# Patient Record
Sex: Female | Born: 1950 | Race: Black or African American | Hispanic: No
Health system: Southern US, Community
[De-identification: ages and names within clinical notes are randomized; demographics above are authoritative.]

---

## 2013-01-13 ENCOUNTER — Inpatient Hospital Stay: Payer: Self-pay | Admitting: Internal Medicine

## 2013-01-13 DIAGNOSIS — I059 Rheumatic mitral valve disease, unspecified: Secondary | ICD-10-CM

## 2013-01-13 LAB — CBC
HCT: 38.1 % (ref 35.0–47.0)
MCH: 28.1 pg (ref 26.0–34.0)
MCV: 84 fL (ref 80–100)
Platelet: 277 10*3/uL (ref 150–440)
RDW: 14.4 % (ref 11.5–14.5)
WBC: 14.1 10*3/uL — ABNORMAL HIGH (ref 3.6–11.0)

## 2013-01-13 LAB — BASIC METABOLIC PANEL
Anion Gap: 4 — ABNORMAL LOW (ref 7–16)
BUN: 13 mg/dL (ref 7–18)
Calcium, Total: 9.4 mg/dL (ref 8.5–10.1)
Chloride: 103 mmol/L (ref 98–107)
Co2: 31 mmol/L (ref 21–32)
Creatinine: 0.79 mg/dL (ref 0.60–1.30)
Glucose: 137 mg/dL — ABNORMAL HIGH (ref 65–99)
Osmolality: 278 (ref 275–301)
Sodium: 138 mmol/L (ref 136–145)

## 2013-01-13 LAB — TROPONIN I: Troponin-I: <0.02 ng/mL

## 2013-01-13 LAB — PRO B NATRIURETIC PEPTIDE: B-Type Natriuretic Peptide: 152 pg/mL — ABNORMAL HIGH (ref 0–125)

## 2013-01-13 LAB — CK TOTAL AND CKMB (NOT AT ARMC): CK, Total: 248 U/L — ABNORMAL HIGH (ref 21–215)

## 2013-02-03 ENCOUNTER — Inpatient Hospital Stay: Payer: Self-pay | Admitting: Internal Medicine

## 2013-02-03 LAB — COMPREHENSIVE METABOLIC PANEL
Albumin: 3.4 g/dL (ref 3.4–5.0)
BUN: 10 mg/dL (ref 7–18)
Chloride: 104 mmol/L (ref 98–107)
Co2: 31 mmol/L (ref 21–32)
Creatinine: 0.58 mg/dL — ABNORMAL LOW (ref 0.60–1.30)
EGFR (African American): >60
Osmolality: 279 (ref 275–301)
Potassium: 3.7 mmol/L (ref 3.5–5.1)
SGPT (ALT): 26 U/L (ref 12–78)
Sodium: 140 mmol/L (ref 136–145)

## 2013-02-03 LAB — URINALYSIS, COMPLETE
Bilirubin,UR: NEGATIVE
Blood: NEGATIVE
Glucose,UR: NEGATIVE mg/dL (ref 0–75)
RBC,UR: 3 /HPF (ref 0–5)
Squamous Epithelial: 2

## 2013-02-03 LAB — CBC
HCT: 35.7 % (ref 35.0–47.0)
MCHC: 33.6 g/dL (ref 32.0–36.0)
MCV: 84 fL (ref 80–100)
WBC: 11.6 10*3/uL — ABNORMAL HIGH (ref 3.6–11.0)

## 2013-02-03 LAB — PROTIME-INR: Prothrombin Time: 12.8 secs (ref 11.5–14.7)

## 2013-02-04 LAB — CBC WITH DIFFERENTIAL/PLATELET
Basophil #: 0 10*3/uL (ref 0.0–0.1)
Basophil %: 0 %
Eosinophil #: 0 10*3/uL (ref 0.0–0.7)
Eosinophil %: 0 %
HCT: 38.4 % (ref 35.0–47.0)
Lymphocyte #: 1.4 10*3/uL (ref 1.0–3.6)
MCH: 28.2 pg (ref 26.0–34.0)
MCHC: 33 g/dL (ref 32.0–36.0)
MCV: 86 fL (ref 80–100)
Monocyte #: 0.2 x10 3/mm (ref 0.2–0.9)
Monocyte %: 1.7 %
Neutrophil #: 11.1 10*3/uL — ABNORMAL HIGH (ref 1.4–6.5)
Neutrophil %: 87.5 %
Platelet: 281 10*3/uL (ref 150–440)
RBC: 4.49 10*6/uL (ref 3.80–5.20)
RDW: 14.2 % (ref 11.5–14.5)

## 2013-02-04 LAB — BASIC METABOLIC PANEL
Co2: 28 mmol/L (ref 21–32)
EGFR (African American): >60
EGFR (Non-African Amer.): 54 — ABNORMAL LOW
Glucose: 155 mg/dL — ABNORMAL HIGH (ref 65–99)
Osmolality: 279 (ref 275–301)
Potassium: 3.8 mmol/L (ref 3.5–5.1)
Sodium: 137 mmol/L (ref 136–145)

## 2013-02-08 LAB — EXPECTORATED SPUTUM ASSESSMENT W GRAM STAIN, RFLX TO RESP C

## 2013-02-08 LAB — CULTURE, BLOOD (SINGLE)

## 2013-02-08 LAB — BASIC METABOLIC PANEL
BUN: 22 mg/dL — ABNORMAL HIGH (ref 7–18)
EGFR (African American): >60
EGFR (Non-African Amer.): >60
Sodium: 137 mmol/L (ref 136–145)

## 2013-08-16 ENCOUNTER — Emergency Department: Payer: Self-pay | Admitting: Emergency Medicine

## 2013-08-16 LAB — CBC WITH DIFFERENTIAL/PLATELET
Basophil #: 0.1 10*3/uL (ref 0.0–0.1)
Basophil %: 0.9 %
Eosinophil #: 0.2 10*3/uL (ref 0.0–0.7)
Eosinophil %: 1.6 %
HCT: 40.7 % (ref 35.0–47.0)
HGB: 13.3 g/dL (ref 12.0–16.0)
Lymphocyte #: 2.6 10*3/uL (ref 1.0–3.6)
Lymphocyte %: 23 %
MCH: 27.1 pg (ref 26.0–34.0)
MCHC: 32.7 g/dL (ref 32.0–36.0)
MCV: 83 fL (ref 80–100)
MONO ABS: 0.5 x10 3/mm (ref 0.2–0.9)
Monocyte %: 4.3 %
NEUTROS PCT: 70.2 %
Neutrophil #: 8 10*3/uL — ABNORMAL HIGH (ref 1.4–6.5)
Platelet: 345 10*3/uL (ref 150–440)
RBC: 4.91 10*6/uL (ref 3.80–5.20)
RDW: 13.9 % (ref 11.5–14.5)
WBC: 11.4 10*3/uL — ABNORMAL HIGH (ref 3.6–11.0)

## 2013-08-16 LAB — BASIC METABOLIC PANEL
Anion Gap: 12 (ref 7–16)
BUN: 20 mg/dL — AB (ref 7–18)
CALCIUM: 10 mg/dL (ref 8.5–10.1)
CO2: 24 mmol/L (ref 21–32)
CREATININE: 1.05 mg/dL (ref 0.60–1.30)
Chloride: 94 mmol/L — ABNORMAL LOW (ref 98–107)
EGFR (African American): >60
GFR CALC NON AF AMER: 56 — AB
Glucose: 409 mg/dL — ABNORMAL HIGH (ref 65–99)
OSMOLALITY: 281 (ref 275–301)
Potassium: 3.7 mmol/L (ref 3.5–5.1)
Sodium: 130 mmol/L — ABNORMAL LOW (ref 136–145)

## 2013-08-22 ENCOUNTER — Inpatient Hospital Stay: Payer: Self-pay | Admitting: Internal Medicine

## 2013-08-22 LAB — COMPREHENSIVE METABOLIC PANEL
ALK PHOS: 133 U/L — AB
ANION GAP: 8 (ref 7–16)
Albumin: 3.4 g/dL (ref 3.4–5.0)
BUN: 19 mg/dL — AB (ref 7–18)
Bilirubin,Total: 0.5 mg/dL (ref 0.2–1.0)
CALCIUM: 10.5 mg/dL — AB (ref 8.5–10.1)
CHLORIDE: 89 mmol/L — AB (ref 98–107)
Co2: 28 mmol/L (ref 21–32)
Creatinine: 1.54 mg/dL — ABNORMAL HIGH (ref 0.60–1.30)
EGFR (African American): 41 — ABNORMAL LOW
EGFR (Non-African Amer.): 35 — ABNORMAL LOW
Glucose: 481 mg/dL — ABNORMAL HIGH (ref 65–99)
Osmolality: 275 (ref 275–301)
Potassium: 3.6 mmol/L (ref 3.5–5.1)
SGOT(AST): 20 U/L (ref 15–37)
SGPT (ALT): 17 U/L (ref 12–78)
Sodium: 125 mmol/L — ABNORMAL LOW (ref 136–145)
Total Protein: 8 g/dL (ref 6.4–8.2)

## 2013-08-22 LAB — CBC
HCT: 40.5 % (ref 35.0–47.0)
HGB: 13.4 g/dL (ref 12.0–16.0)
MCH: 27.5 pg (ref 26.0–34.0)
MCHC: 33.1 g/dL (ref 32.0–36.0)
MCV: 83 fL (ref 80–100)
Platelet: 277 10*3/uL (ref 150–440)
RBC: 4.87 10*6/uL (ref 3.80–5.20)
RDW: 14.2 % (ref 11.5–14.5)
WBC: 8.7 10*3/uL (ref 3.6–11.0)

## 2013-08-22 LAB — URINALYSIS, COMPLETE
BACTERIA: NONE SEEN
Bilirubin,UR: NEGATIVE
Hyaline Cast: 16
NITRITE: NEGATIVE
PH: 5 (ref 4.5–8.0)
Protein: NEGATIVE
RBC,UR: 5 /HPF (ref 0–5)
Specific Gravity: 1.021 (ref 1.003–1.030)
Squamous Epithelial: 8

## 2013-08-22 LAB — TROPONIN I

## 2013-08-23 LAB — LIPID PANEL
Cholesterol: 144 mg/dL (ref 0–200)
HDL Cholesterol: 36 mg/dL — ABNORMAL LOW (ref 40–60)
Ldl Cholesterol, Calc: 88 mg/dL (ref 0–100)
Triglycerides: 101 mg/dL (ref 0–200)
VLDL CHOLESTEROL, CALC: 20 mg/dL (ref 5–40)

## 2013-08-23 LAB — CBC WITH DIFFERENTIAL/PLATELET
BASOS ABS: 0.1 10*3/uL (ref 0.0–0.1)
BASOS PCT: 0.9 %
EOS ABS: 0.2 10*3/uL (ref 0.0–0.7)
Eosinophil %: 2.1 %
HCT: 32.6 % — ABNORMAL LOW (ref 35.0–47.0)
HGB: 11 g/dL — ABNORMAL LOW (ref 12.0–16.0)
LYMPHS PCT: 26 %
Lymphocyte #: 2.2 10*3/uL (ref 1.0–3.6)
MCH: 27.6 pg (ref 26.0–34.0)
MCHC: 33.6 g/dL (ref 32.0–36.0)
MCV: 82 fL (ref 80–100)
MONO ABS: 0.8 x10 3/mm (ref 0.2–0.9)
Monocyte %: 9.7 %
NEUTROS ABS: 5.1 10*3/uL (ref 1.4–6.5)
NEUTROS PCT: 61.3 %
Platelet: 242 10*3/uL (ref 150–440)
RBC: 3.98 10*6/uL (ref 3.80–5.20)
RDW: 14.2 % (ref 11.5–14.5)
WBC: 8.3 10*3/uL (ref 3.6–11.0)

## 2013-08-23 LAB — COMPREHENSIVE METABOLIC PANEL
ALT: 10 U/L — AB (ref 12–78)
ANION GAP: 7 (ref 7–16)
AST: 10 U/L — AB (ref 15–37)
Albumin: 2.7 g/dL — ABNORMAL LOW (ref 3.4–5.0)
Alkaline Phosphatase: 106 U/L
BILIRUBIN TOTAL: 0.4 mg/dL (ref 0.2–1.0)
BUN: 18 mg/dL (ref 7–18)
CO2: 27 mmol/L (ref 21–32)
CREATININE: 1.15 mg/dL (ref 0.60–1.30)
Calcium, Total: 9.8 mg/dL (ref 8.5–10.1)
Chloride: 97 mmol/L — ABNORMAL LOW (ref 98–107)
EGFR (African American): 58 — ABNORMAL LOW
GFR CALC NON AF AMER: 50 — AB
Glucose: 313 mg/dL — ABNORMAL HIGH (ref 65–99)
OSMOLALITY: 276 (ref 275–301)
POTASSIUM: 3.2 mmol/L — AB (ref 3.5–5.1)
Sodium: 131 mmol/L — ABNORMAL LOW (ref 136–145)
TOTAL PROTEIN: 6.5 g/dL (ref 6.4–8.2)

## 2013-08-23 LAB — TROPONIN I: Troponin-I: <0.02 ng/mL

## 2013-08-23 LAB — CK TOTAL AND CKMB (NOT AT ARMC)
CK, TOTAL: 56 U/L
CK, TOTAL: 59 U/L
CK, Total: 47 U/L
CK-MB: 0.5 ng/mL (ref 0.5–3.6)
CK-MB: 0.6 ng/mL (ref 0.5–3.6)
CK-MB: 0.7 ng/mL (ref 0.5–3.6)

## 2013-08-23 LAB — TSH: Thyroid Stimulating Horm: 0.604 u[IU]/mL

## 2013-08-23 LAB — MAGNESIUM
MAGNESIUM: 2 mg/dL
Magnesium: 1.1 mg/dL — ABNORMAL LOW

## 2013-08-23 LAB — HEMOGLOBIN A1C: Hemoglobin A1C: 12.2 % — ABNORMAL HIGH (ref 4.2–6.3)

## 2013-08-24 ENCOUNTER — Ambulatory Visit: Payer: Self-pay | Admitting: Internal Medicine

## 2013-08-24 LAB — CBC WITH DIFFERENTIAL/PLATELET
BASOS PCT: 0.9 %
Basophil #: 0.1 10*3/uL (ref 0.0–0.1)
EOS ABS: 0.1 10*3/uL (ref 0.0–0.7)
Eosinophil %: 1.1 %
HCT: 33.9 % — ABNORMAL LOW (ref 35.0–47.0)
HGB: 11.5 g/dL — ABNORMAL LOW (ref 12.0–16.0)
LYMPHS PCT: 15.9 %
Lymphocyte #: 1.4 10*3/uL (ref 1.0–3.6)
MCH: 27.9 pg (ref 26.0–34.0)
MCHC: 33.9 g/dL (ref 32.0–36.0)
MCV: 82 fL (ref 80–100)
MONO ABS: 1 x10 3/mm — AB (ref 0.2–0.9)
Monocyte %: 11.7 %
Neutrophil #: 6.2 10*3/uL (ref 1.4–6.5)
Neutrophil %: 70.4 %
Platelet: 220 10*3/uL (ref 150–440)
RBC: 4.13 10*6/uL (ref 3.80–5.20)
RDW: 14.3 % (ref 11.5–14.5)
WBC: 8.8 10*3/uL (ref 3.6–11.0)

## 2013-08-24 LAB — PROTIME-INR
INR: 1
Prothrombin Time: 13.1 secs (ref 11.5–14.7)

## 2013-08-24 LAB — URINE CULTURE

## 2013-08-24 LAB — BASIC METABOLIC PANEL
Anion Gap: 7 (ref 7–16)
BUN: 7 mg/dL (ref 7–18)
CHLORIDE: 100 mmol/L (ref 98–107)
CO2: 25 mmol/L (ref 21–32)
Calcium, Total: 9 mg/dL (ref 8.5–10.1)
Creatinine: 0.8 mg/dL (ref 0.60–1.30)
EGFR (African American): >60
GLUCOSE: 229 mg/dL — AB (ref 65–99)
Osmolality: 270 (ref 275–301)
POTASSIUM: 3.8 mmol/L (ref 3.5–5.1)
Sodium: 132 mmol/L — ABNORMAL LOW (ref 136–145)

## 2013-08-26 LAB — CBC WITH DIFFERENTIAL/PLATELET
Basophil #: 0.1 10*3/uL (ref 0.0–0.1)
Basophil %: 0.8 %
EOS ABS: 0.2 10*3/uL (ref 0.0–0.7)
EOS PCT: 2 %
HCT: 31.6 % — ABNORMAL LOW (ref 35.0–47.0)
HGB: 10.7 g/dL — AB (ref 12.0–16.0)
LYMPHS ABS: 1.8 10*3/uL (ref 1.0–3.6)
Lymphocyte %: 18.3 %
MCH: 27.5 pg (ref 26.0–34.0)
MCHC: 33.7 g/dL (ref 32.0–36.0)
MCV: 82 fL (ref 80–100)
MONOS PCT: 12.6 %
Monocyte #: 1.2 x10 3/mm — ABNORMAL HIGH (ref 0.2–0.9)
Neutrophil #: 6.5 10*3/uL (ref 1.4–6.5)
Neutrophil %: 66.3 %
PLATELETS: 210 10*3/uL (ref 150–440)
RBC: 3.87 10*6/uL (ref 3.80–5.20)
RDW: 14.3 % (ref 11.5–14.5)
WBC: 9.8 10*3/uL (ref 3.6–11.0)

## 2013-08-26 LAB — BASIC METABOLIC PANEL
Anion Gap: 5 — ABNORMAL LOW (ref 7–16)
BUN: 5 mg/dL — AB (ref 7–18)
CHLORIDE: 101 mmol/L (ref 98–107)
CO2: 26 mmol/L (ref 21–32)
Calcium, Total: 8.5 mg/dL (ref 8.5–10.1)
Creatinine: 0.72 mg/dL (ref 0.60–1.30)
EGFR (Non-African Amer.): >60
Glucose: 156 mg/dL — ABNORMAL HIGH (ref 65–99)
OSMOLALITY: 265 (ref 275–301)
POTASSIUM: 4.3 mmol/L (ref 3.5–5.1)
Sodium: 132 mmol/L — ABNORMAL LOW (ref 136–145)

## 2013-08-26 LAB — PROTIME-INR
INR: 1
Prothrombin Time: 13.2 secs (ref 11.5–14.7)

## 2013-08-27 LAB — CULTURE, BLOOD (SINGLE)

## 2013-09-02 ENCOUNTER — Ambulatory Visit: Payer: Self-pay | Admitting: Internal Medicine

## 2013-09-07 ENCOUNTER — Ambulatory Visit: Payer: Self-pay

## 2013-09-08 LAB — COMPREHENSIVE METABOLIC PANEL
ANION GAP: 9 (ref 7–16)
AST: 23 U/L (ref 15–37)
Albumin: 2.9 g/dL — ABNORMAL LOW (ref 3.4–5.0)
Alkaline Phosphatase: 93 U/L
BUN: 9 mg/dL (ref 7–18)
Bilirubin,Total: 0.3 mg/dL (ref 0.2–1.0)
CREATININE: 0.8 mg/dL (ref 0.60–1.30)
Calcium, Total: 8.7 mg/dL (ref 8.5–10.1)
Chloride: 103 mmol/L (ref 98–107)
Co2: 29 mmol/L (ref 21–32)
EGFR (Non-African Amer.): >60
GLUCOSE: 150 mg/dL — AB (ref 65–99)
Osmolality: 283 (ref 275–301)
POTASSIUM: 3 mmol/L — AB (ref 3.5–5.1)
SGPT (ALT): 16 U/L (ref 12–78)
Sodium: 141 mmol/L (ref 136–145)
TOTAL PROTEIN: 7.1 g/dL (ref 6.4–8.2)

## 2013-09-08 LAB — CBC CANCER CENTER
BASOS ABS: 0 x10 3/mm (ref 0.0–0.1)
BASOS PCT: 0.5 %
Eosinophil #: 0.4 x10 3/mm (ref 0.0–0.7)
Eosinophil %: 4.9 %
HCT: 34.1 % — ABNORMAL LOW (ref 35.0–47.0)
HGB: 10.7 g/dL — ABNORMAL LOW (ref 12.0–16.0)
LYMPHS ABS: 2.1 x10 3/mm (ref 1.0–3.6)
Lymphocyte %: 27.4 %
MCH: 26.1 pg (ref 26.0–34.0)
MCHC: 31.4 g/dL — ABNORMAL LOW (ref 32.0–36.0)
MCV: 83 fL (ref 80–100)
Monocyte #: 0.5 x10 3/mm (ref 0.2–0.9)
Monocyte %: 6.1 %
NEUTROS ABS: 4.6 x10 3/mm (ref 1.4–6.5)
Neutrophil %: 61.1 %
Platelet: 258 x10 3/mm (ref 150–440)
RBC: 4.11 10*6/uL (ref 3.80–5.20)
RDW: 15.1 % — ABNORMAL HIGH (ref 11.5–14.5)
WBC: 7.5 x10 3/mm (ref 3.6–11.0)

## 2013-09-08 LAB — LACTATE DEHYDROGENASE: LDH: 199 U/L (ref 81–246)

## 2013-09-09 LAB — PATHOLOGY REPORT

## 2013-09-13 ENCOUNTER — Emergency Department: Payer: Self-pay | Admitting: Emergency Medicine

## 2013-09-13 LAB — BASIC METABOLIC PANEL
ANION GAP: 5 — AB (ref 7–16)
Anion Gap: 10 (ref 7–16)
BUN: 11 mg/dL (ref 7–18)
BUN: 10 mg/dL (ref 7–18)
CALCIUM: 8.8 mg/dL (ref 8.5–10.1)
CO2: 28 mmol/L (ref 21–32)
CREATININE: 0.83 mg/dL (ref 0.60–1.30)
CREATININE: 0.76 mg/dL (ref 0.60–1.30)
Calcium, Total: 9.3 mg/dL (ref 8.5–10.1)
Chloride: 100 mmol/L (ref 98–107)
Chloride: 98 mmol/L (ref 98–107)
Co2: 28 mmol/L (ref 21–32)
EGFR (African American): >60
GLUCOSE: 152 mg/dL — AB (ref 65–99)
GLUCOSE: 242 mg/dL — AB (ref 65–99)
Osmolality: 278 (ref 275–301)
Osmolality: 270 (ref 275–301)
POTASSIUM: 3.3 mmol/L — AB (ref 3.5–5.1)
Potassium: 3.5 mmol/L (ref 3.5–5.1)
Sodium: 138 mmol/L (ref 136–145)
Sodium: 131 mmol/L — ABNORMAL LOW (ref 136–145)

## 2013-09-13 LAB — CBC CANCER CENTER
BASOS PCT: 0.5 %
Basophil #: 0.1 x10 3/mm (ref 0.0–0.1)
Eosinophil #: 0.1 x10 3/mm (ref 0.0–0.7)
Eosinophil %: 0.6 %
HCT: 35.6 % (ref 35.0–47.0)
HGB: 11.5 g/dL — ABNORMAL LOW (ref 12.0–16.0)
LYMPHS ABS: 2.2 x10 3/mm (ref 1.0–3.6)
Lymphocyte %: 20.8 %
MCH: 26.6 pg (ref 26.0–34.0)
MCHC: 32.2 g/dL (ref 32.0–36.0)
MCV: 83 fL (ref 80–100)
Monocyte #: 0.6 x10 3/mm (ref 0.2–0.9)
Monocyte %: 6 %
NEUTROS PCT: 72.1 %
Neutrophil #: 7.7 x10 3/mm — ABNORMAL HIGH (ref 1.4–6.5)
Platelet: 251 x10 3/mm (ref 150–440)
RBC: 4.31 10*6/uL (ref 3.80–5.20)
RDW: 15.2 % — AB (ref 11.5–14.5)
WBC: 10.7 x10 3/mm (ref 3.6–11.0)

## 2013-09-13 LAB — URINALYSIS, COMPLETE
Bacteria: NONE SEEN
Bilirubin,UR: NEGATIVE
Blood: NEGATIVE
Glucose,UR: >=500 mg/dL (ref 0–75)
Hyaline Cast: 3
Ketone: NEGATIVE
Leukocyte Esterase: NEGATIVE
Nitrite: NEGATIVE
Ph: 6 (ref 4.5–8.0)
Protein: NEGATIVE
RBC, UR: NONE SEEN /HPF (ref 0–5)
SPECIFIC GRAVITY: 1.016 (ref 1.003–1.030)
Squamous Epithelial: 2

## 2013-09-13 LAB — CBC
HCT: 36.1 % (ref 35.0–47.0)
HGB: 11.6 g/dL — AB (ref 12.0–16.0)
MCH: 26.8 pg (ref 26.0–34.0)
MCHC: 32.1 g/dL (ref 32.0–36.0)
MCV: 83 fL (ref 80–100)
Platelet: 238 10*3/uL (ref 150–440)
RBC: 4.33 10*6/uL (ref 3.80–5.20)
RDW: 15.2 % — AB (ref 11.5–14.5)
WBC: 10.8 10*3/uL (ref 3.6–11.0)

## 2013-09-13 LAB — HEPATIC FUNCTION PANEL A (ARMC)
ALBUMIN: 3.2 g/dL — AB (ref 3.4–5.0)
ALT: 18 U/L (ref 12–78)
Alkaline Phosphatase: 93 U/L
BILIRUBIN DIRECT: 0.1 mg/dL (ref 0.00–0.20)
Bilirubin,Total: 0.4 mg/dL (ref 0.2–1.0)
SGOT(AST): 21 U/L (ref 15–37)
Total Protein: 7.6 g/dL (ref 6.4–8.2)

## 2013-09-13 LAB — TROPONIN I: Troponin-I: <0.02 ng/mL

## 2013-09-13 LAB — LIPASE, BLOOD: Lipase: 912 U/L — ABNORMAL HIGH (ref 73–393)

## 2013-09-15 ENCOUNTER — Ambulatory Visit: Payer: Self-pay | Admitting: Internal Medicine

## 2013-09-15 LAB — LIPASE, BLOOD: Lipase: 409 U/L — ABNORMAL HIGH (ref 73–393)

## 2013-09-20 LAB — CBC CANCER CENTER
BASOS PCT: 0.9 %
Basophil #: 0 x10 3/mm (ref 0.0–0.1)
EOS PCT: 0.8 %
Eosinophil #: 0 x10 3/mm (ref 0.0–0.7)
HCT: 32.3 % — AB (ref 35.0–47.0)
HGB: 10.3 g/dL — AB (ref 12.0–16.0)
Lymphocyte #: 0.9 x10 3/mm — ABNORMAL LOW (ref 1.0–3.6)
Lymphocyte %: 19.9 %
MCH: 26.5 pg (ref 26.0–34.0)
MCHC: 31.8 g/dL — ABNORMAL LOW (ref 32.0–36.0)
MCV: 83 fL (ref 80–100)
Monocyte #: 0.1 x10 3/mm — ABNORMAL LOW (ref 0.2–0.9)
Monocyte %: 1.7 %
Neutrophil #: 3.3 x10 3/mm (ref 1.4–6.5)
Neutrophil %: 76.7 %
PLATELETS: 288 x10 3/mm (ref 150–440)
RBC: 3.88 10*6/uL (ref 3.80–5.20)
RDW: 15.3 % — AB (ref 11.5–14.5)
WBC: 4.4 x10 3/mm (ref 3.6–11.0)

## 2013-09-27 LAB — CBC CANCER CENTER
BASOS PCT: 0.5 %
Basophil #: 0 x10 3/mm (ref 0.0–0.1)
EOS ABS: 0 x10 3/mm (ref 0.0–0.7)
Eosinophil %: 0.5 %
HCT: 34.6 % — AB (ref 35.0–47.0)
HGB: 11 g/dL — ABNORMAL LOW (ref 12.0–16.0)
Lymphocyte #: 1.4 x10 3/mm (ref 1.0–3.6)
Lymphocyte %: 55 %
MCH: 26.4 pg (ref 26.0–34.0)
MCHC: 31.7 g/dL — ABNORMAL LOW (ref 32.0–36.0)
MCV: 83 fL (ref 80–100)
Monocyte #: 0.4 x10 3/mm (ref 0.2–0.9)
Monocyte %: 16.1 %
Neutrophil #: 0.7 x10 3/mm — ABNORMAL LOW (ref 1.4–6.5)
Neutrophil %: 27.9 %
Platelet: 260 x10 3/mm (ref 150–440)
RBC: 4.15 10*6/uL (ref 3.80–5.20)
RDW: 15.3 % — ABNORMAL HIGH (ref 11.5–14.5)
WBC: 2.6 x10 3/mm — ABNORMAL LOW (ref 3.6–11.0)

## 2013-09-27 LAB — BASIC METABOLIC PANEL
ANION GAP: 12 (ref 7–16)
BUN: 10 mg/dL (ref 7–18)
CALCIUM: 9.8 mg/dL (ref 8.5–10.1)
Chloride: 98 mmol/L (ref 98–107)
Co2: 26 mmol/L (ref 21–32)
Creatinine: 0.87 mg/dL (ref 0.60–1.30)
EGFR (African American): >60
EGFR (Non-African Amer.): >60
Glucose: 127 mg/dL — ABNORMAL HIGH (ref 65–99)
OSMOLALITY: 273 (ref 275–301)
Potassium: 3.5 mmol/L (ref 3.5–5.1)
Sodium: 136 mmol/L (ref 136–145)

## 2013-09-27 LAB — HEPATIC FUNCTION PANEL A (ARMC)
ALK PHOS: 113 U/L
AST: 13 U/L — AB (ref 15–37)
Albumin: 3.5 g/dL (ref 3.4–5.0)
Bilirubin, Direct: 0.1 mg/dL (ref 0.00–0.20)
Bilirubin,Total: 0.2 mg/dL (ref 0.2–1.0)
SGPT (ALT): 20 U/L (ref 12–78)
TOTAL PROTEIN: 7.7 g/dL (ref 6.4–8.2)

## 2013-10-04 LAB — CBC CANCER CENTER
Basophil #: 0.1 x10 3/mm (ref 0.0–0.1)
Basophil %: 0.7 %
Eosinophil #: 0 x10 3/mm (ref 0.0–0.7)
Eosinophil %: 0.2 %
HCT: 33.9 % — ABNORMAL LOW (ref 35.0–47.0)
HGB: 10.9 g/dL — AB (ref 12.0–16.0)
Lymphocyte #: 2.4 x10 3/mm (ref 1.0–3.6)
Lymphocyte %: 19.4 %
MCH: 27 pg (ref 26.0–34.0)
MCHC: 32.3 g/dL (ref 32.0–36.0)
MCV: 84 fL (ref 80–100)
MONO ABS: 1.3 x10 3/mm — AB (ref 0.2–0.9)
Monocyte %: 10.8 %
NEUTROS ABS: 8.6 x10 3/mm — AB (ref 1.4–6.5)
Neutrophil %: 68.9 %
Platelet: 378 x10 3/mm (ref 150–440)
RBC: 4.06 10*6/uL (ref 3.80–5.20)
RDW: 16.3 % — ABNORMAL HIGH (ref 11.5–14.5)
WBC: 12.4 x10 3/mm — ABNORMAL HIGH (ref 3.6–11.0)

## 2013-10-04 LAB — CREATININE, SERUM
Creatinine: 0.82 mg/dL (ref 0.60–1.30)
EGFR (African American): >60
EGFR (Non-African Amer.): >60

## 2013-10-11 LAB — CBC CANCER CENTER
Bands: 4 %
Basophil: 1 %
HCT: 34.1 % — ABNORMAL LOW (ref 35.0–47.0)
HGB: 10.9 g/dL — AB (ref 12.0–16.0)
LYMPHS PCT: 19 %
MCH: 26.7 pg (ref 26.0–34.0)
MCHC: 31.9 g/dL — ABNORMAL LOW (ref 32.0–36.0)
MCV: 84 fL (ref 80–100)
Platelet: 215 x10 3/mm (ref 150–440)
RBC: 4.08 10*6/uL (ref 3.80–5.20)
RDW: 15.7 % — AB (ref 11.5–14.5)
Segmented Neutrophils: 75 %
Variant Lymphocyte: 1 %
WBC: 17.2 x10 3/mm — ABNORMAL HIGH (ref 3.6–11.0)

## 2013-10-15 ENCOUNTER — Ambulatory Visit: Payer: Self-pay | Admitting: Internal Medicine

## 2013-10-18 LAB — BASIC METABOLIC PANEL
ANION GAP: 12 (ref 7–16)
BUN: 12 mg/dL (ref 7–18)
CO2: 29 mmol/L (ref 21–32)
Calcium, Total: 8.5 mg/dL (ref 8.5–10.1)
Chloride: 101 mmol/L (ref 98–107)
Creatinine: 1.46 mg/dL — ABNORMAL HIGH (ref 0.60–1.30)
EGFR (Non-African Amer.): 38 — ABNORMAL LOW
GFR CALC AF AMER: 44 — AB
Glucose: 118 mg/dL — ABNORMAL HIGH (ref 65–99)
Osmolality: 284 (ref 275–301)
Potassium: 3.5 mmol/L (ref 3.5–5.1)
Sodium: 142 mmol/L (ref 136–145)

## 2013-10-18 LAB — HEPATIC FUNCTION PANEL A (ARMC)
ALK PHOS: 137 U/L — AB
ALT: 79 U/L — AB (ref 12–78)
Albumin: 3.7 g/dL (ref 3.4–5.0)
BILIRUBIN TOTAL: 0.2 mg/dL (ref 0.2–1.0)
Bilirubin, Direct: 0.1 mg/dL (ref 0.00–0.20)
SGOT(AST): 53 U/L — ABNORMAL HIGH (ref 15–37)
TOTAL PROTEIN: 7.7 g/dL (ref 6.4–8.2)

## 2013-10-18 LAB — CBC CANCER CENTER
BASOS ABS: 0.1 x10 3/mm (ref 0.0–0.1)
BASOS PCT: 0.8 %
EOS ABS: 0 x10 3/mm (ref 0.0–0.7)
EOS PCT: 0.2 %
HCT: 32.6 % — ABNORMAL LOW (ref 35.0–47.0)
HGB: 10.6 g/dL — ABNORMAL LOW (ref 12.0–16.0)
Lymphocyte #: 3.3 x10 3/mm (ref 1.0–3.6)
Lymphocyte %: 21.7 %
MCH: 26.9 pg (ref 26.0–34.0)
MCHC: 32.5 g/dL (ref 32.0–36.0)
MCV: 83 fL (ref 80–100)
Monocyte #: 0.8 x10 3/mm (ref 0.2–0.9)
Monocyte %: 5.5 %
Neutrophil #: 10.9 x10 3/mm — ABNORMAL HIGH (ref 1.4–6.5)
Neutrophil %: 71.8 %
Platelet: 62 x10 3/mm — ABNORMAL LOW (ref 150–440)
RBC: 3.94 10*6/uL (ref 3.80–5.20)
RDW: 15 % — AB (ref 11.5–14.5)
WBC: 15.2 x10 3/mm — ABNORMAL HIGH (ref 3.6–11.0)

## 2013-10-25 ENCOUNTER — Observation Stay: Payer: Self-pay | Admitting: Internal Medicine

## 2013-10-25 LAB — COMPREHENSIVE METABOLIC PANEL
ALBUMIN: 3.4 g/dL (ref 3.4–5.0)
Alkaline Phosphatase: 111 U/L
Anion Gap: 12 (ref 7–16)
BILIRUBIN TOTAL: 0.2 mg/dL (ref 0.2–1.0)
BUN: 23 mg/dL — ABNORMAL HIGH (ref 7–18)
CALCIUM: 8.2 mg/dL — AB (ref 8.5–10.1)
CHLORIDE: 99 mmol/L (ref 98–107)
Co2: 27 mmol/L (ref 21–32)
Creatinine: 3.23 mg/dL — ABNORMAL HIGH (ref 0.60–1.30)
EGFR (African American): 17 — ABNORMAL LOW
GFR CALC NON AF AMER: 14 — AB
Glucose: 102 mg/dL — ABNORMAL HIGH (ref 65–99)
Osmolality: 280 (ref 275–301)
POTASSIUM: 3 mmol/L — AB (ref 3.5–5.1)
SGOT(AST): 25 U/L (ref 15–37)
SGPT (ALT): 39 U/L (ref 12–78)
Sodium: 138 mmol/L (ref 136–145)
TOTAL PROTEIN: 7.1 g/dL (ref 6.4–8.2)

## 2013-10-25 LAB — CBC CANCER CENTER
Basophil #: 0 x10 3/mm (ref 0.0–0.1)
Basophil %: 0.4 %
Eosinophil #: 0 x10 3/mm (ref 0.0–0.7)
Eosinophil %: 0.3 %
HCT: 29.1 % — AB (ref 35.0–47.0)
HGB: 9.5 g/dL — ABNORMAL LOW (ref 12.0–16.0)
Lymphocyte #: 2.2 x10 3/mm (ref 1.0–3.6)
Lymphocyte %: 20.4 %
MCH: 27.6 pg (ref 26.0–34.0)
MCHC: 32.7 g/dL (ref 32.0–36.0)
MCV: 84 fL (ref 80–100)
Monocyte #: 0.9 x10 3/mm (ref 0.2–0.9)
Monocyte %: 8.6 %
NEUTROS ABS: 7.6 x10 3/mm — AB (ref 1.4–6.5)
Neutrophil %: 70.3 %
PLATELETS: 319 x10 3/mm (ref 150–440)
RBC: 3.46 10*6/uL — AB (ref 3.80–5.20)
RDW: 15.7 % — ABNORMAL HIGH (ref 11.5–14.5)
WBC: 10.8 x10 3/mm (ref 3.6–11.0)

## 2013-10-26 LAB — CBC WITH DIFFERENTIAL/PLATELET
Basophil #: 0 10*3/uL (ref 0.0–0.1)
Basophil %: 0.3 %
EOS PCT: 0.2 %
Eosinophil #: 0 10*3/uL (ref 0.0–0.7)
HCT: 25.3 % — AB (ref 35.0–47.0)
HGB: 8.4 g/dL — ABNORMAL LOW (ref 12.0–16.0)
Lymphocyte #: 1.9 10*3/uL (ref 1.0–3.6)
Lymphocyte %: 21.8 %
MCH: 28.2 pg (ref 26.0–34.0)
MCHC: 33.1 g/dL (ref 32.0–36.0)
MCV: 85 fL (ref 80–100)
Monocyte #: 0.8 x10 3/mm (ref 0.2–0.9)
Monocyte %: 9.3 %
NEUTROS PCT: 68.4 %
Neutrophil #: 5.9 10*3/uL (ref 1.4–6.5)
Platelet: 265 10*3/uL (ref 150–440)
RBC: 2.97 10*6/uL — ABNORMAL LOW (ref 3.80–5.20)
RDW: 15.8 % — ABNORMAL HIGH (ref 11.5–14.5)
WBC: 8.7 10*3/uL (ref 3.6–11.0)

## 2013-10-26 LAB — BASIC METABOLIC PANEL
Anion Gap: 7 (ref 7–16)
BUN: 18 mg/dL (ref 7–18)
Calcium, Total: 8 mg/dL — ABNORMAL LOW (ref 8.5–10.1)
Chloride: 103 mmol/L (ref 98–107)
Co2: 27 mmol/L (ref 21–32)
Creatinine: 1.53 mg/dL — ABNORMAL HIGH (ref 0.60–1.30)
EGFR (African American): 41 — ABNORMAL LOW
EGFR (Non-African Amer.): 36 — ABNORMAL LOW
Glucose: 99 mg/dL (ref 65–99)
Osmolality: 276 (ref 275–301)
Potassium: 3.3 mmol/L — ABNORMAL LOW (ref 3.5–5.1)
SODIUM: 137 mmol/L (ref 136–145)

## 2013-10-27 LAB — BASIC METABOLIC PANEL
Anion Gap: 8 (ref 7–16)
BUN: 8 mg/dL (ref 7–18)
CALCIUM: 7.9 mg/dL — AB (ref 8.5–10.1)
CO2: 27 mmol/L (ref 21–32)
Chloride: 107 mmol/L (ref 98–107)
Creatinine: 0.84 mg/dL (ref 0.60–1.30)
EGFR (Non-African Amer.): >60
Glucose: 86 mg/dL (ref 65–99)
OSMOLALITY: 281 (ref 275–301)
Potassium: 3.4 mmol/L — ABNORMAL LOW (ref 3.5–5.1)
SODIUM: 142 mmol/L (ref 136–145)

## 2013-11-01 LAB — BASIC METABOLIC PANEL
Anion Gap: 8 (ref 7–16)
BUN: 6 mg/dL — AB (ref 7–18)
CREATININE: 0.91 mg/dL (ref 0.60–1.30)
Calcium, Total: 8.6 mg/dL (ref 8.5–10.1)
Chloride: 103 mmol/L (ref 98–107)
Co2: 28 mmol/L (ref 21–32)
EGFR (African American): >60
EGFR (Non-African Amer.): >60
Glucose: 110 mg/dL — ABNORMAL HIGH (ref 65–99)
Osmolality: 276 (ref 275–301)
Potassium: 3.1 mmol/L — ABNORMAL LOW (ref 3.5–5.1)
Sodium: 139 mmol/L (ref 136–145)

## 2013-11-01 LAB — CBC CANCER CENTER
BASOS ABS: 0.1 x10 3/mm (ref 0.0–0.1)
BASOS PCT: 0.8 %
Eosinophil #: 0.1 x10 3/mm (ref 0.0–0.7)
Eosinophil %: 0.9 %
HCT: 28.8 % — ABNORMAL LOW (ref 35.0–47.0)
HGB: 9.7 g/dL — ABNORMAL LOW (ref 12.0–16.0)
Lymphocyte #: 1.4 x10 3/mm (ref 1.0–3.6)
Lymphocyte %: 17.7 %
MCH: 27.8 pg (ref 26.0–34.0)
MCHC: 33.7 g/dL (ref 32.0–36.0)
MCV: 83 fL (ref 80–100)
MONOS PCT: 12.2 %
Monocyte #: 1 x10 3/mm — ABNORMAL HIGH (ref 0.2–0.9)
NEUTROS PCT: 68.4 %
Neutrophil #: 5.5 x10 3/mm (ref 1.4–6.5)
Platelet: 215 x10 3/mm (ref 150–440)
RBC: 3.48 10*6/uL — ABNORMAL LOW (ref 3.80–5.20)
RDW: 16.6 % — ABNORMAL HIGH (ref 11.5–14.5)
WBC: 8 x10 3/mm (ref 3.6–11.0)

## 2013-11-07 ENCOUNTER — Observation Stay: Payer: Self-pay | Admitting: Internal Medicine

## 2013-11-07 LAB — COMPREHENSIVE METABOLIC PANEL
ALBUMIN: 3.2 g/dL — AB (ref 3.4–5.0)
ALK PHOS: 132 U/L — AB
ALT: 43 U/L (ref 12–78)
Anion Gap: 6 — ABNORMAL LOW (ref 7–16)
BUN: 16 mg/dL (ref 7–18)
Bilirubin,Total: 0.7 mg/dL (ref 0.2–1.0)
CALCIUM: 8.8 mg/dL (ref 8.5–10.1)
CHLORIDE: 102 mmol/L (ref 98–107)
CO2: 27 mmol/L (ref 21–32)
CREATININE: 1.04 mg/dL (ref 0.60–1.30)
EGFR (African American): >60
EGFR (Non-African Amer.): 57 — ABNORMAL LOW
GLUCOSE: 120 mg/dL — AB (ref 65–99)
Osmolality: 272 (ref 275–301)
Potassium: 3.4 mmol/L — ABNORMAL LOW (ref 3.5–5.1)
SGOT(AST): 48 U/L — ABNORMAL HIGH (ref 15–37)
SODIUM: 135 mmol/L — AB (ref 136–145)
Total Protein: 7.6 g/dL (ref 6.4–8.2)

## 2013-11-07 LAB — CBC
HCT: 29.5 % — ABNORMAL LOW (ref 35.0–47.0)
HGB: 9.6 g/dL — ABNORMAL LOW (ref 12.0–16.0)
MCH: 27.9 pg (ref 26.0–34.0)
MCHC: 32.5 g/dL (ref 32.0–36.0)
MCV: 86 fL (ref 80–100)
PLATELETS: 138 10*3/uL — AB (ref 150–440)
RBC: 3.44 10*6/uL — ABNORMAL LOW (ref 3.80–5.20)
RDW: 16.8 % — AB (ref 11.5–14.5)
WBC: 12.2 10*3/uL — AB (ref 3.6–11.0)

## 2013-11-07 LAB — TROPONIN I: Troponin-I: <0.02 ng/mL

## 2013-11-07 LAB — PRO B NATRIURETIC PEPTIDE: B-Type Natriuretic Peptide: 147 pg/mL — ABNORMAL HIGH (ref 0–125)

## 2013-11-08 LAB — POTASSIUM: Potassium: 3.6 mmol/L (ref 3.5–5.1)

## 2013-11-08 LAB — SODIUM: Sodium: 134 mmol/L — ABNORMAL LOW (ref 136–145)

## 2013-11-15 ENCOUNTER — Ambulatory Visit: Payer: Self-pay | Admitting: Internal Medicine

## 2013-11-15 LAB — HEPATIC FUNCTION PANEL A (ARMC)
ALK PHOS: 112 U/L
Albumin: 3.1 g/dL — ABNORMAL LOW (ref 3.4–5.0)
BILIRUBIN TOTAL: 0.2 mg/dL (ref 0.2–1.0)
Bilirubin, Direct: 0.1 mg/dL (ref 0.00–0.20)
SGOT(AST): 18 U/L (ref 15–37)
SGPT (ALT): 34 U/L (ref 12–78)
TOTAL PROTEIN: 7.2 g/dL (ref 6.4–8.2)

## 2013-11-15 LAB — CBC CANCER CENTER
BASOS ABS: 0.1 x10 3/mm (ref 0.0–0.1)
Basophil %: 1 %
EOS PCT: 0.3 %
Eosinophil #: 0 x10 3/mm (ref 0.0–0.7)
HCT: 25.8 % — ABNORMAL LOW (ref 35.0–47.0)
HGB: 8.4 g/dL — ABNORMAL LOW (ref 12.0–16.0)
LYMPHS PCT: 25.2 %
Lymphocyte #: 2.6 x10 3/mm (ref 1.0–3.6)
MCH: 28.3 pg (ref 26.0–34.0)
MCHC: 32.7 g/dL (ref 32.0–36.0)
MCV: 86 fL (ref 80–100)
MONO ABS: 0.9 x10 3/mm (ref 0.2–0.9)
Monocyte %: 8.7 %
Neutrophil #: 6.8 x10 3/mm — ABNORMAL HIGH (ref 1.4–6.5)
Neutrophil %: 64.8 %
Platelet: 36 x10 3/mm — ABNORMAL LOW (ref 150–440)
RBC: 2.99 10*6/uL — AB (ref 3.80–5.20)
RDW: 15.9 % — ABNORMAL HIGH (ref 11.5–14.5)
WBC: 10.5 x10 3/mm (ref 3.6–11.0)

## 2013-11-15 LAB — CREATININE, SERUM
Creatinine: 0.91 mg/dL (ref 0.60–1.30)
EGFR (African American): >60
EGFR (Non-African Amer.): >60

## 2013-11-22 LAB — CBC CANCER CENTER
BASOS ABS: 0.1 x10 3/mm (ref 0.0–0.1)
BASOS PCT: 0.6 %
EOS ABS: 0 x10 3/mm (ref 0.0–0.7)
Eosinophil %: 0.3 %
HCT: 26.7 % — ABNORMAL LOW (ref 35.0–47.0)
HGB: 8.7 g/dL — ABNORMAL LOW (ref 12.0–16.0)
LYMPHS PCT: 22.2 %
Lymphocyte #: 2 x10 3/mm (ref 1.0–3.6)
MCH: 28.6 pg (ref 26.0–34.0)
MCHC: 32.7 g/dL (ref 32.0–36.0)
MCV: 88 fL (ref 80–100)
MONOS PCT: 9.3 %
Monocyte #: 0.8 x10 3/mm (ref 0.2–0.9)
NEUTROS ABS: 6.2 x10 3/mm (ref 1.4–6.5)
Neutrophil %: 67.6 %
Platelet: 331 x10 3/mm (ref 150–440)
RBC: 3.05 10*6/uL — AB (ref 3.80–5.20)
RDW: 16.6 % — AB (ref 11.5–14.5)
WBC: 9.1 x10 3/mm (ref 3.6–11.0)

## 2013-11-22 LAB — BASIC METABOLIC PANEL
Anion Gap: 12 (ref 7–16)
BUN: 6 mg/dL — ABNORMAL LOW (ref 7–18)
CALCIUM: 9.1 mg/dL (ref 8.5–10.1)
CO2: 27 mmol/L (ref 21–32)
CREATININE: 0.88 mg/dL (ref 0.60–1.30)
Chloride: 104 mmol/L (ref 98–107)
EGFR (African American): >60
Glucose: 94 mg/dL (ref 65–99)
Osmolality: 282 (ref 275–301)
Potassium: 2.9 mmol/L — ABNORMAL LOW (ref 3.5–5.1)
Sodium: 143 mmol/L (ref 136–145)

## 2013-11-29 LAB — CBC CANCER CENTER
Basophil #: 0 x10 3/mm (ref 0.0–0.1)
Basophil %: 0.6 %
EOS PCT: 0.3 %
Eosinophil #: 0 x10 3/mm (ref 0.0–0.7)
HCT: 24.4 % — ABNORMAL LOW (ref 35.0–47.0)
HGB: 8.1 g/dL — ABNORMAL LOW (ref 12.0–16.0)
Lymphocyte #: 1.4 x10 3/mm (ref 1.0–3.6)
Lymphocyte %: 27.7 %
MCH: 29.1 pg (ref 26.0–34.0)
MCHC: 33 g/dL (ref 32.0–36.0)
MCV: 88 fL (ref 80–100)
MONOS PCT: 0.9 %
Monocyte #: 0 x10 3/mm — ABNORMAL LOW (ref 0.2–0.9)
Neutrophil #: 3.5 x10 3/mm (ref 1.4–6.5)
Neutrophil %: 70.5 %
Platelet: 164 x10 3/mm (ref 150–440)
RBC: 2.77 10*6/uL — ABNORMAL LOW (ref 3.80–5.20)
RDW: 17.4 % — ABNORMAL HIGH (ref 11.5–14.5)
WBC: 5 x10 3/mm (ref 3.6–11.0)

## 2013-11-29 LAB — POTASSIUM: POTASSIUM: 3.4 mmol/L — AB (ref 3.5–5.1)

## 2013-11-29 LAB — MAGNESIUM: MAGNESIUM: 1.2 mg/dL — AB

## 2013-12-06 LAB — BASIC METABOLIC PANEL
Anion Gap: 13 (ref 7–16)
BUN: 6 mg/dL — AB (ref 7–18)
CO2: 24 mmol/L (ref 21–32)
Calcium, Total: 9.1 mg/dL (ref 8.5–10.1)
Chloride: 104 mmol/L (ref 98–107)
Creatinine: 0.99 mg/dL (ref 0.60–1.30)
GLUCOSE: 97 mg/dL (ref 65–99)
OSMOLALITY: 279 (ref 275–301)
Potassium: 3.1 mmol/L — ABNORMAL LOW (ref 3.5–5.1)
Sodium: 141 mmol/L (ref 136–145)

## 2013-12-06 LAB — CBC CANCER CENTER
BANDS NEUTROPHIL: 14 %
EOS PCT: 1 %
HCT: 23.5 % — ABNORMAL LOW (ref 35.0–47.0)
HGB: 7.6 g/dL — ABNORMAL LOW (ref 12.0–16.0)
LYMPHS PCT: 19 %
MCH: 28.4 pg (ref 26.0–34.0)
MCHC: 32.2 g/dL (ref 32.0–36.0)
MCV: 88 fL (ref 80–100)
MONOS PCT: 6 %
Metamyelocyte: 6 %
Myelocyte: 7 %
Platelet: 37 x10 3/mm — ABNORMAL LOW (ref 150–440)
RBC: 2.66 10*6/uL — AB (ref 3.80–5.20)
RDW: 17.2 % — ABNORMAL HIGH (ref 11.5–14.5)
SEGMENTED NEUTROPHILS: 45 %
Variant Lymphocyte: 2 %
WBC: 21.3 x10 3/mm — AB (ref 3.6–11.0)

## 2013-12-06 LAB — HEPATIC FUNCTION PANEL A (ARMC)
ALBUMIN: 3.4 g/dL (ref 3.4–5.0)
ALT: 32 U/L (ref 12–78)
Alkaline Phosphatase: 119 U/L — ABNORMAL HIGH
Bilirubin, Direct: <0.05 mg/dL (ref 0.00–0.20)
Bilirubin,Total: 0.2 mg/dL (ref 0.2–1.0)
SGOT(AST): 28 U/L (ref 15–37)
TOTAL PROTEIN: 7.3 g/dL (ref 6.4–8.2)

## 2013-12-15 ENCOUNTER — Ambulatory Visit: Payer: Self-pay | Admitting: Internal Medicine

## 2013-12-21 LAB — CBC CANCER CENTER
BASOS ABS: 0.1 x10 3/mm (ref 0.0–0.1)
Basophil %: 1.4 %
EOS ABS: 0.2 x10 3/mm (ref 0.0–0.7)
EOS PCT: 3.2 %
HCT: 26.6 % — AB (ref 35.0–47.0)
HGB: 8.7 g/dL — AB (ref 12.0–16.0)
LYMPHS ABS: 1.8 x10 3/mm (ref 1.0–3.6)
Lymphocyte %: 26.6 %
MCH: 30.3 pg (ref 26.0–34.0)
MCHC: 32.9 g/dL (ref 32.0–36.0)
MCV: 92 fL (ref 80–100)
MONOS PCT: 11.3 %
Monocyte #: 0.8 x10 3/mm (ref 0.2–0.9)
Neutrophil #: 4 x10 3/mm (ref 1.4–6.5)
Neutrophil %: 57.5 %
PLATELETS: 187 x10 3/mm (ref 150–440)
RBC: 2.88 10*6/uL — ABNORMAL LOW (ref 3.80–5.20)
RDW: 21.2 % — AB (ref 11.5–14.5)
WBC: 6.9 x10 3/mm (ref 3.6–11.0)

## 2013-12-21 LAB — BASIC METABOLIC PANEL
ANION GAP: 11 (ref 7–16)
BUN: 11 mg/dL (ref 7–18)
CHLORIDE: 106 mmol/L (ref 98–107)
CREATININE: 0.83 mg/dL (ref 0.60–1.30)
Calcium, Total: 9.2 mg/dL (ref 8.5–10.1)
Co2: 25 mmol/L (ref 21–32)
EGFR (African American): >60
EGFR (Non-African Amer.): >60
GLUCOSE: 102 mg/dL — AB (ref 65–99)
Osmolality: 283 (ref 275–301)
Potassium: 3.6 mmol/L (ref 3.5–5.1)
Sodium: 142 mmol/L (ref 136–145)

## 2014-01-07 LAB — CBC CANCER CENTER
Basophil #: 0 x10 3/mm (ref 0.0–0.1)
Basophil %: 0.2 %
EOS ABS: 0.1 x10 3/mm (ref 0.0–0.7)
Eosinophil %: 1.1 %
HCT: 21.1 % — AB (ref 35.0–47.0)
HGB: 6.9 g/dL — ABNORMAL LOW (ref 12.0–16.0)
Lymphocyte #: 2.3 x10 3/mm (ref 1.0–3.6)
Lymphocyte %: 34.5 %
MCH: 30.6 pg (ref 26.0–34.0)
MCHC: 32.5 g/dL (ref 32.0–36.0)
MCV: 94 fL (ref 80–100)
Monocyte #: 0.6 x10 3/mm (ref 0.2–0.9)
Monocyte %: 9.4 %
NEUTROS PCT: 54.8 %
Neutrophil #: 3.7 x10 3/mm (ref 1.4–6.5)
PLATELETS: 34 x10 3/mm — AB (ref 150–440)
RBC: 2.24 10*6/uL — ABNORMAL LOW (ref 3.80–5.20)
RDW: 16.9 % — ABNORMAL HIGH (ref 11.5–14.5)
WBC: 6.7 x10 3/mm (ref 3.6–11.0)

## 2014-01-07 LAB — BASIC METABOLIC PANEL
ANION GAP: 11 (ref 7–16)
BUN: 14 mg/dL (ref 7–18)
CALCIUM: 8.9 mg/dL (ref 8.5–10.1)
CREATININE: 1.17 mg/dL (ref 0.60–1.30)
Chloride: 101 mmol/L (ref 98–107)
Co2: 27 mmol/L (ref 21–32)
EGFR (Non-African Amer.): 49 — ABNORMAL LOW
GFR CALC AF AMER: 57 — AB
GLUCOSE: 91 mg/dL (ref 65–99)
Osmolality: 278 (ref 275–301)
Potassium: 3 mmol/L — ABNORMAL LOW (ref 3.5–5.1)
Sodium: 139 mmol/L (ref 136–145)

## 2014-01-07 LAB — LIPID PANEL
Cholesterol: 192 mg/dL (ref 0–200)
HDL Cholesterol: 42 mg/dL (ref 40–60)
LDL CHOLESTEROL, CALC: 124 mg/dL — AB (ref 0–100)
Triglycerides: 130 mg/dL (ref 0–200)
VLDL Cholesterol, Calc: 26 mg/dL (ref 5–40)

## 2014-01-07 LAB — HEPATIC FUNCTION PANEL A (ARMC)
ALT: 22 U/L
Albumin: 3.2 g/dL — ABNORMAL LOW (ref 3.4–5.0)
Alkaline Phosphatase: 129 U/L — ABNORMAL HIGH
Bilirubin, Direct: <0.1 mg/dL (ref 0.00–0.20)
Bilirubin,Total: 0.2 mg/dL (ref 0.2–1.0)
SGOT(AST): 18 U/L (ref 15–37)
TOTAL PROTEIN: 7.5 g/dL (ref 6.4–8.2)

## 2014-01-15 ENCOUNTER — Ambulatory Visit: Payer: Self-pay | Admitting: Internal Medicine

## 2014-01-18 LAB — BASIC METABOLIC PANEL
ANION GAP: 10 (ref 7–16)
BUN: 12 mg/dL (ref 7–18)
CALCIUM: 8.4 mg/dL — AB (ref 8.5–10.1)
CO2: 24 mmol/L (ref 21–32)
Chloride: 105 mmol/L (ref 98–107)
Creatinine: 1.07 mg/dL (ref 0.60–1.30)
GFR CALC NON AF AMER: 54 — AB
Glucose: 132 mg/dL — ABNORMAL HIGH (ref 65–99)
Osmolality: 279 (ref 275–301)
POTASSIUM: 3.3 mmol/L — AB (ref 3.5–5.1)
Sodium: 139 mmol/L (ref 136–145)

## 2014-01-18 LAB — CBC CANCER CENTER
BASOS PCT: 0.7 %
Basophil #: 0 x10 3/mm (ref 0.0–0.1)
EOS PCT: 1.3 %
Eosinophil #: 0.1 x10 3/mm (ref 0.0–0.7)
HCT: 23 % — ABNORMAL LOW (ref 35.0–47.0)
HGB: 7.7 g/dL — ABNORMAL LOW (ref 12.0–16.0)
LYMPHS ABS: 1.5 x10 3/mm (ref 1.0–3.6)
Lymphocyte %: 26.8 %
MCH: 32.3 pg (ref 26.0–34.0)
MCHC: 33.2 g/dL (ref 32.0–36.0)
MCV: 97 fL (ref 80–100)
MONO ABS: 0.5 x10 3/mm (ref 0.2–0.9)
Monocyte %: 8.8 %
NEUTROS ABS: 3.6 x10 3/mm (ref 1.4–6.5)
Neutrophil %: 62.4 %
PLATELETS: 119 x10 3/mm — AB (ref 150–440)
RBC: 2.37 10*6/uL — AB (ref 3.80–5.20)
RDW: 19 % — AB (ref 11.5–14.5)
WBC: 5.7 x10 3/mm (ref 3.6–11.0)

## 2014-01-25 LAB — CBC CANCER CENTER
BASOS PCT: 0.3 %
Basophil #: 0 x10 3/mm (ref 0.0–0.1)
EOS ABS: 0 x10 3/mm (ref 0.0–0.7)
EOS PCT: 0.7 %
HCT: 23.3 % — ABNORMAL LOW (ref 35.0–47.0)
HGB: 7.5 g/dL — ABNORMAL LOW (ref 12.0–16.0)
LYMPHS ABS: 1.2 x10 3/mm (ref 1.0–3.6)
Lymphocyte %: 43.3 %
MCH: 31.8 pg (ref 26.0–34.0)
MCHC: 32.3 g/dL (ref 32.0–36.0)
MCV: 99 fL (ref 80–100)
MONO ABS: 0 x10 3/mm — AB (ref 0.2–0.9)
MONOS PCT: 1.2 %
NEUTROS ABS: 1.6 x10 3/mm (ref 1.4–6.5)
Neutrophil %: 54.5 %
Platelet: 60 x10 3/mm — ABNORMAL LOW (ref 150–440)
RBC: 2.37 10*6/uL — ABNORMAL LOW (ref 3.80–5.20)
RDW: 17.4 % — AB (ref 11.5–14.5)
WBC: 2.9 x10 3/mm — ABNORMAL LOW (ref 3.6–11.0)

## 2014-01-25 LAB — MAGNESIUM: MAGNESIUM: 1.1 mg/dL — AB

## 2014-01-25 LAB — POTASSIUM: POTASSIUM: 3.9 mmol/L (ref 3.5–5.1)

## 2014-02-01 LAB — CBC CANCER CENTER
BASOS PCT: 0.4 %
Basophil #: 0 x10 3/mm (ref 0.0–0.1)
EOS ABS: 0 x10 3/mm (ref 0.0–0.7)
Eosinophil %: 0.5 %
HCT: 20.3 % — AB (ref 35.0–47.0)
HGB: 6.8 g/dL — AB (ref 12.0–16.0)
Lymphocyte #: 2.1 x10 3/mm (ref 1.0–3.6)
Lymphocyte %: 30.8 %
MCH: 32.2 pg (ref 26.0–34.0)
MCHC: 33.3 g/dL (ref 32.0–36.0)
MCV: 97 fL (ref 80–100)
MONOS PCT: 7 %
Monocyte #: 0.5 x10 3/mm (ref 0.2–0.9)
Neutrophil #: 4.2 x10 3/mm (ref 1.4–6.5)
Neutrophil %: 61.3 %
Platelet: 3 x10 3/mm — CL (ref 150–440)
RBC: 2.1 10*6/uL — AB (ref 3.80–5.20)
RDW: 16.1 % — ABNORMAL HIGH (ref 11.5–14.5)
WBC: 6.9 x10 3/mm (ref 3.6–11.0)

## 2014-02-01 LAB — BASIC METABOLIC PANEL
Anion Gap: 12 (ref 7–16)
BUN: 11 mg/dL (ref 7–18)
CO2: 25 mmol/L (ref 21–32)
Calcium, Total: 8.6 mg/dL (ref 8.5–10.1)
Chloride: 102 mmol/L (ref 98–107)
Creatinine: 1.08 mg/dL (ref 0.60–1.30)
EGFR (African American): >60
GFR CALC NON AF AMER: 54 — AB
Glucose: 100 mg/dL — ABNORMAL HIGH (ref 65–99)
Osmolality: 277 (ref 275–301)
Potassium: 3.4 mmol/L — ABNORMAL LOW (ref 3.5–5.1)
Sodium: 139 mmol/L (ref 136–145)

## 2014-02-01 LAB — HEPATIC FUNCTION PANEL A (ARMC)
ALBUMIN: 3.1 g/dL — AB (ref 3.4–5.0)
Alkaline Phosphatase: 147 U/L — ABNORMAL HIGH
Bilirubin, Direct: <0.1 mg/dL (ref 0.00–0.20)
Bilirubin,Total: 0.2 mg/dL (ref 0.2–1.0)
SGOT(AST): 15 U/L (ref 15–37)
SGPT (ALT): 18 U/L
Total Protein: 7.3 g/dL (ref 6.4–8.2)

## 2014-02-02 LAB — CANCER CTR PLATELET CT: Platelet: 39 x10 3/mm — ABNORMAL LOW (ref 150–440)

## 2014-02-03 LAB — CBC CANCER CENTER
BASOS ABS: 0.1 x10 3/mm (ref 0.0–0.1)
Basophil %: 0.6 %
EOS PCT: 0.3 %
Eosinophil #: 0 x10 3/mm (ref 0.0–0.7)
HCT: 23.2 % — AB (ref 35.0–47.0)
HGB: 7.8 g/dL — ABNORMAL LOW (ref 12.0–16.0)
LYMPHS ABS: 2.7 x10 3/mm (ref 1.0–3.6)
Lymphocyte %: 31.5 %
MCH: 30.8 pg (ref 26.0–34.0)
MCHC: 33.5 g/dL (ref 32.0–36.0)
MCV: 92 fL (ref 80–100)
MONO ABS: 1 x10 3/mm — AB (ref 0.2–0.9)
Monocyte %: 11.5 %
Neutrophil #: 4.8 x10 3/mm (ref 1.4–6.5)
Neutrophil %: 56.1 %
PLATELETS: 37 x10 3/mm — AB (ref 150–440)
RBC: 2.52 10*6/uL — ABNORMAL LOW (ref 3.80–5.20)
RDW: 19.8 % — ABNORMAL HIGH (ref 11.5–14.5)
WBC: 8.5 x10 3/mm (ref 3.6–11.0)

## 2014-02-07 LAB — CBC CANCER CENTER
BASOS ABS: 0 x10 3/mm (ref 0.0–0.1)
Basophil %: 0.3 %
EOS ABS: 0 x10 3/mm (ref 0.0–0.7)
EOS PCT: 0.4 %
HCT: 22.7 % — ABNORMAL LOW (ref 35.0–47.0)
HGB: 7.3 g/dL — AB (ref 12.0–16.0)
Lymphocyte #: 2 x10 3/mm (ref 1.0–3.6)
Lymphocyte %: 29.5 %
MCH: 30.3 pg (ref 26.0–34.0)
MCHC: 32.1 g/dL (ref 32.0–36.0)
MCV: 95 fL (ref 80–100)
Monocyte #: 0.7 x10 3/mm (ref 0.2–0.9)
Monocyte %: 10.7 %
NEUTROS PCT: 59.1 %
Neutrophil #: 4.1 x10 3/mm (ref 1.4–6.5)
Platelet: 46 x10 3/mm — ABNORMAL LOW (ref 150–440)
RBC: 2.41 10*6/uL — ABNORMAL LOW (ref 3.80–5.20)
RDW: 18.8 % — AB (ref 11.5–14.5)
WBC: 6.9 x10 3/mm (ref 3.6–11.0)

## 2014-02-15 ENCOUNTER — Ambulatory Visit: Payer: Self-pay | Admitting: Internal Medicine

## 2014-02-22 LAB — CBC CANCER CENTER
BASOS PCT: 1.3 %
Basophil #: 0.1 x10 3/mm (ref 0.0–0.1)
Eosinophil #: 0.2 x10 3/mm (ref 0.0–0.7)
Eosinophil %: 2.2 %
HCT: 24.4 % — AB (ref 35.0–47.0)
HGB: 8.1 g/dL — ABNORMAL LOW (ref 12.0–16.0)
LYMPHS PCT: 29.1 %
Lymphocyte #: 2.4 x10 3/mm (ref 1.0–3.6)
MCH: 31.1 pg (ref 26.0–34.0)
MCHC: 33.1 g/dL (ref 32.0–36.0)
MCV: 94 fL (ref 80–100)
Monocyte #: 0.8 x10 3/mm (ref 0.2–0.9)
Monocyte %: 9.4 %
Neutrophil #: 4.8 x10 3/mm (ref 1.4–6.5)
Neutrophil %: 58 %
Platelet: 191 x10 3/mm (ref 150–440)
RBC: 2.59 10*6/uL — AB (ref 3.80–5.20)
RDW: 20.1 % — ABNORMAL HIGH (ref 11.5–14.5)
WBC: 8.2 x10 3/mm (ref 3.6–11.0)

## 2014-03-17 ENCOUNTER — Ambulatory Visit: Payer: Self-pay | Admitting: Internal Medicine

## 2014-03-22 LAB — CBC CANCER CENTER
Basophil #: 0 x10 3/mm (ref 0.0–0.1)
Basophil %: 0.7 %
Eosinophil #: 0.2 x10 3/mm (ref 0.0–0.7)
Eosinophil %: 3.2 %
HCT: 28.1 % — AB (ref 35.0–47.0)
HGB: 8.8 g/dL — AB (ref 12.0–16.0)
Lymphocyte #: 2.3 x10 3/mm (ref 1.0–3.6)
Lymphocyte %: 33 %
MCH: 29.6 pg (ref 26.0–34.0)
MCHC: 31.5 g/dL — ABNORMAL LOW (ref 32.0–36.0)
MCV: 94 fL (ref 80–100)
MONOS PCT: 7.1 %
Monocyte #: 0.5 x10 3/mm (ref 0.2–0.9)
NEUTROS ABS: 3.9 x10 3/mm (ref 1.4–6.5)
NEUTROS PCT: 56 %
Platelet: 150 x10 3/mm (ref 150–440)
RBC: 2.99 10*6/uL — AB (ref 3.80–5.20)
RDW: 17.1 % — ABNORMAL HIGH (ref 11.5–14.5)
WBC: 7 x10 3/mm (ref 3.6–11.0)

## 2014-04-17 ENCOUNTER — Ambulatory Visit: Payer: Self-pay | Admitting: Internal Medicine

## 2014-05-02 LAB — CBC CANCER CENTER
BASOS PCT: 0.2 %
Basophil #: 0 x10 3/mm (ref 0.0–0.1)
Eosinophil #: 0.2 x10 3/mm (ref 0.0–0.7)
Eosinophil %: 3.1 %
HCT: 30.6 % — ABNORMAL LOW (ref 35.0–47.0)
HGB: 10 g/dL — ABNORMAL LOW (ref 12.0–16.0)
LYMPHS ABS: 2.1 x10 3/mm (ref 1.0–3.6)
Lymphocyte %: 33.1 %
MCH: 30.2 pg (ref 26.0–34.0)
MCHC: 32.5 g/dL (ref 32.0–36.0)
MCV: 93 fL (ref 80–100)
Monocyte #: 0.4 x10 3/mm (ref 0.2–0.9)
Monocyte %: 6.7 %
NEUTROS ABS: 3.6 x10 3/mm (ref 1.4–6.5)
Neutrophil %: 56.9 %
Platelet: 155 x10 3/mm (ref 150–440)
RBC: 3.3 10*6/uL — ABNORMAL LOW (ref 3.80–5.20)
RDW: 15.1 % — ABNORMAL HIGH (ref 11.5–14.5)
WBC: 6.3 x10 3/mm (ref 3.6–11.0)

## 2014-05-02 LAB — HEPATIC FUNCTION PANEL A (ARMC)
ALK PHOS: 147 U/L — AB
Albumin: 3.2 g/dL — ABNORMAL LOW (ref 3.4–5.0)
BILIRUBIN TOTAL: 0.2 mg/dL (ref 0.2–1.0)
Bilirubin, Direct: 0.1 mg/dL (ref 0.0–0.2)
SGOT(AST): 14 U/L — ABNORMAL LOW (ref 15–37)
SGPT (ALT): 15 U/L
Total Protein: 7.7 g/dL (ref 6.4–8.2)

## 2014-05-02 LAB — BASIC METABOLIC PANEL
Anion Gap: 10 (ref 7–16)
BUN: 11 mg/dL (ref 7–18)
CALCIUM: 9.5 mg/dL (ref 8.5–10.1)
CHLORIDE: 104 mmol/L (ref 98–107)
Co2: 29 mmol/L (ref 21–32)
Creatinine: 0.98 mg/dL (ref 0.60–1.30)
EGFR (African American): >60
GLUCOSE: 112 mg/dL — AB (ref 65–99)
Osmolality: 285 (ref 275–301)
Potassium: 3.2 mmol/L — ABNORMAL LOW (ref 3.5–5.1)
SODIUM: 143 mmol/L (ref 136–145)

## 2014-05-17 ENCOUNTER — Ambulatory Visit: Payer: Self-pay | Admitting: Internal Medicine

## 2014-06-14 LAB — CBC CANCER CENTER
BASOS PCT: 0.7 %
Basophil #: 0 x10 3/mm (ref 0.0–0.1)
EOS ABS: 0.2 x10 3/mm (ref 0.0–0.7)
Eosinophil %: 4.5 %
HCT: 33.6 % — AB (ref 35.0–47.0)
HGB: 10.9 g/dL — ABNORMAL LOW (ref 12.0–16.0)
Lymphocyte #: 0.7 x10 3/mm — ABNORMAL LOW (ref 1.0–3.6)
Lymphocyte %: 17.3 %
MCH: 28.6 pg (ref 26.0–34.0)
MCHC: 32.5 g/dL (ref 32.0–36.0)
MCV: 88 fL (ref 80–100)
Monocyte #: 0.4 x10 3/mm (ref 0.2–0.9)
Monocyte %: 9.4 %
NEUTROS ABS: 2.8 x10 3/mm (ref 1.4–6.5)
NEUTROS PCT: 68.1 %
PLATELETS: 144 x10 3/mm — AB (ref 150–440)
RBC: 3.83 10*6/uL (ref 3.80–5.20)
RDW: 15 % — AB (ref 11.5–14.5)
WBC: 4.1 x10 3/mm (ref 3.6–11.0)

## 2014-06-17 ENCOUNTER — Ambulatory Visit: Payer: Self-pay | Admitting: Internal Medicine

## 2014-07-18 ENCOUNTER — Ambulatory Visit: Payer: Self-pay | Admitting: Internal Medicine

## 2014-08-11 ENCOUNTER — Emergency Department: Payer: Self-pay | Admitting: Emergency Medicine

## 2014-08-16 ENCOUNTER — Ambulatory Visit
Admit: 2014-08-16 | Disposition: A | Discharge status: Home / Self Care | Payer: Self-pay | Attending: Internal Medicine | Admitting: Internal Medicine

## 2014-09-16 DEATH — deceased

## 2014-10-07 NOTE — H&P (Signed)
PATIENT NAME:  Deanna Santiago, Deanna Santiago MR#:  [id number] DATE OF BIRTH:  18-Mar-1951  DATE OF ADMISSION:  02/03/2013  REFERRING PHYSICIAN: Sheryl L. Mindi Junker, MD  PRIMARY CARE PHYSICIAN: Dennison Mascot, MD  CHIEF COMPLAINT: Shortness of breath.   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 Of note, the patient had a hospitalization in late July/early August and was also diagnosed with diagnosis of diastolic CHF. At that time, she was noted to have respiratory failure secondary to COPD and CHF. She was discharged on azithromycin and prednisone taper. She stated that she was doing well until about 3 days ago or so where she started to have shortness of breath, wheezing. She resorted to using her oxygen around the clock and still did not feel better and was using multiple albuterol inhaler rounds, but still felt unwell. She could not breathe easily and EMS was called today. She was found to be barely able to speak with a single word and was given stacked nebulizers and 125 mg of steroids by EMS per staff here. When she came in here, she could barely speak more than 1 word and was started on BiPAP. There is no ABG. She was noted to be tachypneic, tachycardic, without any fever and had minimal leukocytosis. She feels somewhat better, but still is dyspneic, and hospitalist services were contacted for further evaluation and management.   PAST MEDICAL HISTORY: COPD/emphysema, history of heavy tobacco abuse (quit last year), hypertension, history of obstructive sleep apnea not on CPAP, history of obesity, history of chronic respiratory failure with nocturnal oxygen 2 liters, history of diastolic congestive heart failure diagnosed last admission.  FAMILY HISTORY: Positive for diabetes, stroke, hypertension and heart trouble.   SOCIAL HISTORY: Lives with  her son. Ex-smoker, quit last year. No alcohol or drug use. Does not work.   ALLERGIES: PHENERGAN.   OUTPATIENT MEDICATIONS: Albuterol CFC-free 90 mcg per inhaled aerosol 2 puffs inhaled 4 times a day as needed for shortness of breath, amlodipine 10 mg daily, furosemide 20 mg daily, lisinopril 20 mg daily, meloxicam 15 mg once a day.   REVIEW OF SYSTEMS:   CONSTITUTIONAL: No fever or chills, fatigue or weakness. Positive for about 70-pound weight gain in the last year.  EYES: No blurry vision or double vision.  EARS, NOSE, THROAT: No tinnitus, hearing loss or postnasal drip.  RESPIRATORY: Positive for shortness of breath, dry cough and dyspnea on exertion. Positive for COPD. No hemoptysis.  CARDIOVASCULAR: No chest pain or tightness. Has high blood pressure. History of diastolic congestive heart failure. No palpitations but has orthopnea.  GASTROINTESTINAL: No nausea, vomiting, diarrhea, abdominal pain, reflux, bloody stools or dark stools.  GENITOURINARY: Denies dysuria or hematuria.  HEMATOLOGIC AND LYMPHATIC: No anemia or easy bruising.  SKIN: No rashes.  MUSCULOSKELETAL: Has chronic back pain and arthritis in the hip.  NEUROLOGIC: Denies focal weakness, numbness, stroke or TIA.  PSYCHIATRIC: Denies anxiety or insomnia.   PHYSICAL EXAMINATION: VITAL SIGNS: Temperature on arrival noted to be 98, pulse rate initially 105, respiratory rate 26, blood pressure 140/78, O2 saturation 97% on oxygen. Currently the patient is on BiPAP, 16/6, 30% FiO2, and respiratory rate is 32.  GENERAL: The patient is an obese African American female sitting on bed in mild respiratory distress.  HEENT: Normocephalic, atraumatic. Pupils are equal and reactive. Anicteric sclerae. Extraocular muscles intact.  NECK: Supple. No thyroid  tenderness. No cervical lymphadenopathy.  CARDIOVASCULAR: S1, S2, regular rate and rhythm. No murmurs, rubs or gallops.  LUNGS: Bilateral diffuse wheezing with decreased breath sounds  in all fields. No crackles could be heard.  ABDOMEN: Soft, nontender, nondistended. Positive bowel sounds in all quadrants. No organomegaly appreciated.  EXTREMITIES: No significant lower extremity edema.  NEUROLOGIC: Cranial nerves II through XII grossly intact. Strength is 5 out of 5 in all extremities. Sensation is intact to light touch.  PSYCHIATRIC: Awake, alert, oriented x 3.   LABORATORY, DIAGNOSTIC AND RADIOLOGICAL DATA: WBC 11.6, hemoglobin 12; platelets are 250; hematocrit is 35.7. Troponin is negative, CK-MB and CK total negative INR is 0.9. Alkaline phosphatase is 158; otherwise, LFTs are within normal limits. Glucose 108. BNP is 104, BUN 10, creatinine 0.58, sodium 140, potassium 3.7. X-ray of the chest done, not read yet. EKG: Normal sinus rhythm. Rate is 98. Biatrial enlargement. There are some T wave inversions in V5, V6. I cannot unfortunately pull the previous EKG, as the chart does not allow me, but there was note of nonspecific T wave abnormalities in the previous EKG's read, but I cannot pull up the actual EKG.   ASSESSMENT AND PLAN: We have a pleasant 64 year old African American female with recent hospitalization for acute on chronic respiratory failure for  chronic obstructive pulmonary disease and diastolic congestive heart failure, which was newly diagnosed, who presents with acute on chronic respiratory failure, tachypnea and dyspnea on exertion, currently BiPAP dependent. The patient came in tachypneic, tachycardic, and in significant respiratory distress. She feels somewhat better. I believe this is likely secondary to acute on chronic COPD exacerbation and unlikely to be acute on chronic diastolic congestive heart failure. She has a BNP which is not significantly elevated and she has no significant lower extremity edema, and here presentation as more consistent with chronic obstructive pulmonary disease flare. Of note, she is not on any chronic bronchodilators, i.e. Advair,  Spiriva, which are long-acting, and she is only on albuterol as needed. She will be started on Spiriva and she should be discharged on Advair or Symbicort or the likes. We would obtain sputum cultures, start her on high-dose IV steroids and nebulizers around-the-clock while awake as well as when needed while she is short of breath. every 2 hours in between if needed. She has no fever and has no minimum leukocytosis, which makes me think she is not having a pneumonia. We would start her on azithromycin,  see what the sputum cultures show, follow with the x-ray of the chest. In regards to her chronic diastolic congestive heart failure, this is stable and she will be continued on her Lasix orally. I would try to wean her off of the BiPAP later today of possible. Continue her blood pressure medications. She was counseled about weight loss and diet for obesity. She will be started on deep vein thrombosis prophylaxis with Lovenox.  TOTAL CRITICAL CARE TIME SPENT: 55 minutes.    ____________________________ Krystal EatonShayiq Emmit Oriley, MD sa:jm D: 02/03/2013 13:11:43 ET T: 02/03/2013 16:43:21 ET JOB#: [id number]  cc: Krystal EatonShayiq Yasseen Salls, MD, <Dictator> Dennison MascotLemont Morrisey, MD Marcelle SmilingSHAYIQ [facility] Center[facility]ADZIA MD ELECTRONICALLY SIGNED 02/22/2013 12:05

## 2014-10-07 NOTE — H&P (Signed)
PATIENT NAME:  Lourdes SledgeSMITH, Bernise L MR#:  [id number] DATE OF BIRTH:  Nov 15, 1950  DATE OF ADMISSION:  01/13/2013  PRIMARY CARE PHYSICIAN:  Dr. Thana AtesMorrisey.  CHIEF COMPLAINT: Shortness of breath for 3 or 4 days.   HISTORY OF PRESENT ILLNESS:   Ms. [name] is a pleasant [age] year old African American female with history of COPD, who is an ex-heavy smoker, quit in 2013, history of hypertension, comes in the Emergency Room after she started having progressively increasing shortness of breath where she was not able to complete short sentences. She was noticing, in last few days, she has been getting tired doing chores at home and used her inhalers more than usual. She was found to be in respiratory distress with not able to complete short sentences, was tachypneic and tachycardic. She is being admitted for acute respiratory failure secondary to COPD flare.   PAST MEDICAL HISTORY: 1.  COPD/emphysema.  2.  Heavy tobacco abuse, quit in November 2013.  3.  Hypertension.   FAMILY HISTORY: Positive for stroke and hypertension along with renal failure.   SOCIAL HISTORY: Lives at home with her son.  Ex-smoker,  quit in November 2013. Does not do any drugs. The patient does not work.   MEDICATIONS: The patient does not have her medication list. She takes a blood pressure medicine, and she takes albuterol oral inhaler and a dietary supplement.   ALLERGIES: PHENERGAN.   REVIEW OF SYSTEMS:  CONSTITUTIONAL: No fever. Positive for fatigue and weakness.  EYES: No blurred or double vision or glaucoma.  EARS, NOSE, THROAT: No tinnitus, ear pain, hearing loss or postnasal drip.   RESPIRATORY: Positive for shortness of breath, cough and dyspnea on exertion. Negative for hemoptysis.  CARDIOVASCULAR: No chest tightness. Positive for hypertension. Negative for syncope and palpitations.  GASTROINTESTINAL: No nausea, vomiting, diarrhea, abdominal pain or GERD.  GENITOURINARY: No dysuria, hematuria or frequency. ENDOCRINE: No  polyuria, nocturia or thyroid problems.  HEMATOLOGY: No anemia or easy bruising.  SKIN: No acne or rash.  MUSCULOSKELETAL: Positive for back pain and hip pain.  NEUROLOGIC: No CVA, TIA, dysarthria or tremors.   PSYCHIATRIC: No anxiety or depression or bipolar disorder. All other systems reviewed and negative.   PHYSICAL EXAMINATION: GENERAL: The patient is awake, alert, oriented x 3. Mild to moderate distress due to shortness of breath.  VITAL SIGNS: She is tachycardic with pulse of 94, respirations 28, blood pressure is 162/63, pulse ox is 88% on room air, now is 92% on 3 liters nasal cannula oxygen.  HEENT: Atraumatic, normocephalic. Pupils: PERRLA. EOM intact. Oral mucosa is dry.  NECK: Supple. No JVD. No carotid bruit.  RESPIRATORY: There are distant breath sounds, decreased breath sounds at the bases. No wheezing, use of accessory muscles or respiratory distress. The patient does have minimal labored breathing.  CARDIOVASCULAR: Tachycardia present. No murmur heard. PMI not lateralized. Chest nontender.  EXTREMITIES: Good pedal pulses, good femoral pulses. No lower extremity edema.  ABDOMEN: Soft, benign, nontender. No organomegaly. Positive bowel sounds.  NEUROLOGIC: Grossly intact cranial nerves II through XII. No motor or sensory deficit.  PSYCHIATRIC: The patient is awake, alert, oriented x 3.  SKIN: Warm and dry.   RADIOLOGIC AND LABORATORY DATA:  Blood glucose is 171.   Chest x-ray shows findings consistent with COPD.  Mild cardiomegaly and interstitial edema.   Troponin is less than 0.02. White count is 14.1, H and H are 12.7 and 38.1. Platelet count is 277. Glucose is 137. The rest of the chemistry is normal.  B-type natriuretic peptide is 152.   ASSESSMENT AND PLAN: A 64 year old Ms. Mast with history of chronic obstructive pulmonary disease/emphysema, ex-tobacco abuse and hypertension, comes in with increasing shortness of breath, was found to be hypoxic with sats 88% on room  air, along with tachypnea and mild respiratory distress with labored breathing. The patient is being admitted with:  1.  Acute hypoxic respiratory failure secondary to chronic obstructive pulmonary disease exacerbation with possible mild acute bronchitis. The patient will be started on IV Solu-Medrol around-the-clock, along with nebs or inhalers, oxygen and empiric p.o. Zithromax. We will monitor the patient's oxygen sats, ambulate her and assess need for home oxygen.  2.  Hypertension. We will resume home meds once patient's pharmacy has been contacted and medication list obtained.  3.  History of  obstructive sleep apnea. The patient is intolerant to CPAP machine. She has not used her machine in the last 10 to 15 years. She is not wanting to get retested and neither does she want to use the CPAP machine,  despite me discussing the risks and complications of not using it, and problems related to sleep apnea.  4.  Morbid obesity. Dietary changes and exercise discussed with the patient.  5.  Deep vein thrombosis prophylaxis. Heparin subQ t.i.d.  6.  Further workup according to  the patient's clinical course. Hospital admission plan was discussed with the patient. No family members present.   CODE STATUS:  The patient is a full code.   TIME SPENT: 50 minutes.      ____________________________ Wylie Hail Allena Katz, MD sap:dmm D: 01/13/2013 12:19:00 ET T: 01/13/2013 12:39:36 ET JOB#: [id number]  cc: Da Michelle A. Allena Katz, MD, <Dictator> Dennison Mascot, MD Willow Ora MD ELECTRONICALLY SIGNED 02/05/2013 5:37

## 2014-10-07 NOTE — Discharge Summary (Signed)
PATIENT NAME:  Deanna Santiago, Deanna Santiago MR#:  [id number] DATE OF BIRTH:  1950-11-10  DATE OF ADMISSION:  02/03/2013 DATE OF DISCHARGE:  02/08/2013  DISCHARGE DIAGNOSES: 1.  Acute chronic obstructive pulmonary disease exacerbation.  2.  Hypertension.  3.  Chronic diastolic congestive heart failure.  4.  Obesity.  5.  Obstructive sleep apnea.   CONDITION ON DISCHARGE: Stable.   CODE STATUS: DO NOT RESUSCITATE.   MEDICATIONS ON DISCHARGE: 1.  Albuterol inhaler 2 puffs 4 times a day as needed for shortness of breath.  2.  Amlodipine 10 mg oral tablet once a day.  3.  Furosemide 20 mg once a day.  4.  Meloxicam 15 mg oral tablet once a day.  5.  Prednisone, start 60 mg and taper 5 mg daily until complete.  6.  Lisinopril 20 mg oral tablet 2 tablets once a day.  7.  Tiotropium 18 mcg inhalation capsule once a day.  8.  Advair Diskus 1 puff 2 times a day.   DIET ON DISCHARGE: Low-sodium, regular consistency.   ACTIVITY: As tolerated.   TIMEFRAME TO FOLLOWUP: Within 1 to 2 weeks. Routine followups with primary care physician, Dr. Dennison MascotLemont Morrisey in a month.   HISTORY OF PRESENT ILLNESS: A per H and P by Dr. Jacques NavyAhmadzia on the 20th of August, a 64 year old [place] female with COPD and emphysema, on no oxygen use who came to the ER with respiratory failure due to COPD and CHF. She had a recent admission to the hospital and was discharged with a prednisone taper and azithromycin. She was doing well until 3 days before admission, and re-started having the symptoms. Was feeling unwell and could not feel better or breathe better, even with continuous oxygen use and using nebulizer at home, so initially she was started on BiPAP. Was noted to have tachypnea and tachycardia, and mainly leukocytosis on admission.   HOSPITAL COURSE AND STAY:  1.  For acute chronic obstructive pulmonary disease exacerbation, was initially on BiPAP but then later on we were successful in weaning her off BiPAP, and she was  tolerating nicely, so gradually cutting down on IV Solu-Medrol dosing, and continue inhalers, and continuing Levaquin with gradual improvement, which was slower than usual and took 4 to 5 days to recover in the hospital. She finished 5 days' Levaquin course in hospital and was feeling significantly better, so we discharged home with tapering steroids without any antibiotics.  2.  Diastolic congestive heart failure: Chronic and stable. Her Lasix continued at home dose.  3.  Obstructive sleep apnea: She was not using CPAP at home. She was only using oxygen, and she claimed to be having good sleep study recently in the past.  4. Hypertension: Lisinopril, and Lasix and Norvasc continued. Blood pressure remained under control. We added Spiriva and Advair to her home medication, and gave her a nebulizer at home to use for better control of her symptoms.   IMPORTANT LABORATORY RESULTS IN THE HOSPITAL: White cell count was 12,700, creatinine 1.08. Potassium was 3.8, hemoglobin 12.7, magnesium was 1.9.   Chest x-ray without evidence of cardiopulmonary disease.   Sputum culture appears to be normal so far at 48 hours.   Total time spent on this discharge: Forty-five minutes    ____________________________ Deanna PigeonVaibhavkumar G. Elisabeth PigeonVachhani, MD vgv:dm D: 02/11/2013 07:43:40 ET T: 02/11/2013 09:27:04 ET JOB#: [id number]  cc: Deanna PigeonVaibhavkumar G. Elisabeth PigeonVachhani, MD, <Dictator> Dennison MascotLemont Morrisey, MD Heath GoldVAIBHAVKUMAR [facility] LLC[facility]ACHHANI MD ELECTRONICALLY SIGNED 02/16/2013 0:48

## 2014-10-07 NOTE — Discharge Summary (Signed)
PATIENT NAME:  Deanna Santiago, [name] MR#:  [id number] DATE OF BIRTH:  1950/06/27  DATE OF ADMISSION:  01/13/2013 DATE OF DISCHARGE:  01/15/2013  PRESENTING COMPLAINT: Shortness of breath and leg swelling.   DISCHARGE DIAGNOSES:  1. Acute chronic obstructive pulmonary disease exacerbation.   2. History of ex-heavy tobacco abuse.  3. Acute diastolic congestive heart failure, new.  4. Obstructive sleep apnea, noncompliant with continuous positive airway pressure.  5. Morbid obesity.  6. Hypertension.   CONDITION ON DISCHARGE: Fair. Saturations 95% on 2 liters nasal cannula, 86% on room air on exertion.   CODE STATUS: Full code.   DISCHARGE MEDICATIONS: 1. Albuterol 2 puffs 4 times a day as needed.  2. Olmesartan 20 mg daily.  3. Prednisone taper.  4. Amlodipine 10 mg daily.  5. Lasix 20 mg daily.  6. Azithromycin 250 p.o. daily.   DIET: A 2 gram sodium diet.   OXYGEN: 2 liters nasal cannula continuous.   FOLLOWUP: With Dr. Maryellen PileEason on your scheduled appointment.   CONSULTATION: None.   DIAGNOSTIC STUDIES:  Echo Doppler showed EF of 60% to 65%, mildly dilated left atrium. Impaired of relaxation pattern of LV diastolic filling. Normal RVSP. Normal right ventricular size and systolic function.  Chest x-ray showed cardiomegaly with possible mild interstitial edema.  Cardiac enzymes x3 remained stable. Troponins x3 were negative.  Hemoglobin A1c is 6.1.  CBC within normal limits.  Glucose 137. Creatinine is 0.79.  EKG showed sinus rhythm with possible left ventricular hypertrophy.   BRIEF SUMMARY OF HOSPITAL COURSE: Guy FrancoClaudia Barber is a 64 year old African-American female with history of heavy tobacco abuse in the past and hypertension, who comes in with increasing shortness of breath, was found to be hypoxic with saturations 88% on room air along with tachypnea and mild respiratory distress. She was admitted with:   1. Acute hypoxic respiratory failure secondary to chronic obstructive  pulmonary disease exacerbation with possible mild acute bronchitis and mild diastolic congestive heart failure. The patient was started on Solu-Medrol around the clock, changed to p.o. taper, along with nebulizers and inhalers, oxygen and empiric Zithromax. Saturations went down in the 70s and 80s. She was set up with home oxygen. Echo was done, results as above were noted.  2. Hypertension, olmesartan, amlodipine and Lasix were started. The patient's hydrochlorothiazide was discontinued. She was asked not to take her Tribenzor.  3. History of sleep apnea. The patient is intolerant of CPAP machine. She has not used her machine in the last 10 to 15 years. She is not wanting to get retested, neither does she want to use the CPAP machine despite me discussing the risks and complications of not using it and problems related to sleep apnea.  4. Morbid obesity. Dietary changes and exercise discussed.  5. Deep vein thrombosis prophylaxis. Subcutaneous heparin t.i.d.  6. The patient will follow up with Dr. Maryellen PileEason on her scheduled appointment.   TIME SPENT: 40 minutes.   ____________________________ Wylie HailSona A. Allena KatzPatel, MD sap:OSi D: 01/16/2013 07:17:21 ET T: 01/16/2013 08:05:08 ET JOB#: [id number]  cc: Draiden Mirsky A. Allena KatzPatel, MD, <Dictator> Serita ShellerErnest B. Maryellen PileEason, MD Willow OraSONA A Tyquon Near MD ELECTRONICALLY SIGNED 02/04/2013 7:29

## 2014-10-08 NOTE — H&P (Signed)
PATIENT NAME:  Deanna Santiago, Deanna Santiago MR#:  [id number] DATE OF BIRTH:  07/15Leonides Cave/1952  DATE OF ADMISSION:  11/07/2013  PRIMARY CARE PHYSICIAN:  None.  ONCOLOGIST:  Dr. Sherrlyn HockPandit  CHIEF COMPLAINT: Shortness of breath for the last several days.   HISTORY OF PRESENT ILLNESS: Ms. Guy FrancoClaudia Feldpausch is a 64 year old Caucasian female with history of metastatic small-cell lung cancer, including brain mets, status post palliative radiation and on chemo with carboplatin, [facility], comes to the Emergency Room after she started noticing increasing shortness of breath. She has been having some coughing spells with yellow phlegm. Denies any fever. She was found to have COPD exacerbation, and is being admitted for further evaluation and management.   PAST MEDICAL HISTORY: 1.  As stated above, metastatic small-cell lung cancer, including brain mets, status post palliative radiation and on chemo.  2.  Hypertension.  3.  Hyperlipidemia.  4.  GERD.  5.  History of COPD, emphysema. Heavy tobacco abuse, quit last year. 6.  History of sleep apnea, not on CPAP. 7.  Obesity. 8.  History of congestive heart failure, diastolic.  SOCIAL HISTORY: She is a former smoker. Nonalcoholic. Lives at home with her son.   ALLERGIES: Phenergan.  FAMILY HISTORY: Positive for diabetes, stroke, hypertension, and heart disease.   HOME MEDICATIONS: 1.  Simvastatin 20 mg daily.  2.  Protonix 40 mg daily.  3.  Metoprolol 25 mg b.i.d.  4.  Hydrochlorothiazide/lisinopril 12.5/20 p.o. daily.   REVIEW OF SYSTEMS:  CONSTITUTIONAL: No fever. Positive for fatigue, weakness.  EYES: No blurred or double vision, glaucoma or cataracts.  ENT: No tinnitus, ear pain, hearing loss, or postnasal drip.  RESPIRATORY: Positive for cough, shortness of breath. Positive for COPD exacerbation.  CARDIOVASCULAR: No chest pain, orthopnea, edema. Positive for hypertension.  GASTROINTESTINAL: No nausea, vomiting, diarrhea, abdominal pain, or  hematemesis.  GENITOURINARY: No dysuria, hematuria, frequency, or incontinence.  ENDOCRINE: No polyuria, nocturia, or thyroid problems.  HEMATOLOGY: No anemia, easy bruising or bleeding.  SKIN: No acne, rash, or lesions.  MUSCULOSKELETAL: No arthritis or cramps.  NEUROLOGIC: No CVA, TIA, or weakness. PSYCHIATRIC: No anxiety, depression, or bipolar disorder.  All other systems reviewed and negative.   PHYSICAL EXAMINATION: GENERAL: The patient is awake, alert, oriented x 3, not in acute distress.  VITAL SIGNS: Afebrile, pulse is 96, blood pressure is 122/71, sats are 99% on room air.  HEENT: Atraumatic, normocephalic. Pupils PERLA. EOM intact. Oral mucosa is moist.  NECK: Supple. No JVD. No carotid bruit.  RESPIRATORY: There are decreased breath sounds. Coarse breath sounds present. No audible wheezing or rales or rhonchi. No use of accessory muscles.  CARDIOVASCULAR: Both heart sounds are normal. Rate, rhythm regular. PMI not lateralized. Chest nontender.  EXTREMITIES: Good pedal pulses, good femoral pulses. No lower extremity edema.  ABDOMEN: Soft, obese, nontender. No organomegaly. Positive bowel sounds.  NEUROLOGIC:  Grossly intact cranial nerves II to XII. No motor or sensory deficits.  PSYCHIATRIC: The patient is awake, alert, oriented x 3.   DATA:  Chest x-ray: No acute cardiopulmonary process. Pleural parenchymal scarring in the left lung apex, consistent with treated lung malignancy. Tip of the right IJ single lumen Port-A-Catheter is internal jugular vein. Glucose is 120, sodium 135, potassium 3.4, he in the internal jugular vein. Glucose is 128, BUN sodium is 135, potassium 3.4, white count is 12.2, H and H is 9.6 and 29.5, platelet count is 138. Troponin is 0.02. B-type natriuretic peptide is 147.   ASSESSMENT: A  64 year old Deanna Santiago with history of metastatic small-cell lung cancer, undergoing chemotherapy, comes in with increasing shortness of breath. She is being  admitted with  1.  Acute on chronic respiratory failure secondary to COPD exacerbation. The patient does not seemed to be hypoxic. However, she has coarse breath sounds and shortness of breath. She received IV Solu-Medrol. Will admit patient for observation on medical floor. Continue Solu-Medrol around the clock, along with breathing treatments and inhalers, and give her an empiric Z-Pak. Her chest x-ray is negative for pneumonia. Follow blood cultures.   2.  Metastatic small-cell lung cancer. Recently received chemotherapy per Dr. Sherrlyn Hock. Her counts are stable.  3.  Hypertension. Continue her metoprolol hydrochlorothiazide and lisinopril.   4.  Morbid obesity.   5.  Hyperlipidemia. Continue simvastatin.   6.  Deep vein thrombosis prophylaxis. Subcutaneous Lovenox.   Further workup according to patient's clinical course. Hospital admission plan was discussed with patient and patient's sister, who was present in the Emergency Room.   TIME SPENT: 50 minutes.    ____________________________ Wylie Hail Allena Katz, MD sap:mr D: 11/07/2013 18:49:00 ET Santiago: 11/07/2013 19:58:28 ET JOB#: [id number]  cc: Ronnita Paz A. Allena Katz, MD, <Dictator> Sandeep R. Sherrlyn Hock, MD  Willow Ora MD ELECTRONICALLY SIGNED 11/08/2013 14:37

## 2014-10-08 NOTE — Discharge Summary (Signed)
Dates of Admission and Diagnosis:  Date of Admission 22-Aug-2013   Date of Discharge 27-Aug-2013   Admitting Diagnosis pneumonia   Final Diagnosis Lung mass on left side- s/p Bronchoscopy and biopsy 08/28/13- follow with Oncology clinic for further plan- need PET scan as out pt. New onset DM Pneumonia and cystitis on presentation- 5 days of Abx finished. Ac worsening renal function- may be dehydration- recovered.    Chief Complaint/History of Present Illness The patient is 64 year old African American female recently diagnosed with diabetes mellitus approximately one week ago, has been experiencing burning type of chest pain since last night. The patient describes it like indigestion. It is in the midsternal area with no radiation, but associated with nausea. It is intermittent in nature and swallowing ice makes it feel better. No radiation. Denies any association of the shortness of breath. Sometimes feels lightheaded, but denies any loss of consciousness. Denies any cough, fever or sick contacts. She was seen by some cardiologist in the past and had a stress test done several years ago, which was normal. Chest x-ray has revealed opacity in the left hilum, anterior medial left upper lobe, which could mostly reflect pneumonia. UA is positive for UTI. Accu-Cheks have revealed 457 blood sugar, eventually it went up to 530 at 19 hours. The patient has received 10 units of IV insulin following which her Accu-Chek went down to 191 at 2155. During my examination, the patient is feeling dry and asking for ice chips. No family members at bedside. Denies any chest pain during my examination. Initial troponin is negative. The patient has received IV Rocephin in the ER and 2 liters of fluid boluses given in the ER. The patient also has received aspirin in the ER. Denies any weight loss or weight gain recently.   Allergies:  Phenergan: Itching  Thyroid:  09-Mar-15 04:39   Thyroid Stimulating Hormone 0.604  (0.45-4.50 (International Unit)  ----------------------- Pregnant patients have  different reference  ranges for TSH:  - - - - - - - - - -  Pregnant, first trimetser:  0.36 - 2.50 uIU/mL)  Hepatic:  09-Mar-15 04:39   Bilirubin, Total 0.4  Alkaline Phosphatase 106 (45-117 NOTE: New Reference Range 05/07/13)  SGPT (ALT)  10  SGOT (AST)  10  Total Protein, Serum 6.5  Albumin, Serum  2.7  Routine Chem:  09-Mar-15 04:39   Glucose, Serum  313  BUN 18  Creatinine (comp) 1.15  Sodium, Serum  131  Potassium, Serum  3.2  Chloride, Serum  97  CO2, Serum 27  Calcium (Total), Serum 9.8  Anion Gap 7  Osmolality (calc) 276  eGFR (African American)  58  eGFR (Non-African American)  50 (eGFR values <71m/min/1.73 m2 may be an indication of chronic kidney disease (CKD). Calculated eGFR is useful in patients with stable renal function. The eGFR calculation will not be reliable in acutely ill patients when serum creatinine is changing rapidly. It is not useful in  patients on dialysis. The eGFR calculation may not be applicable to patients at the low and high extremes of body sizes, pregnant women, and vegetarians.)  Magnesium, Serum  1.1 (1.8-2.4 THERAPEUTIC RANGE: 4-7 mg/dL TOXIC: > 10 mg/dL  -----------------------)  Hemoglobin A1c ([facility])  12.2 (The American Diabetes Association recommends that a primary goal of therapy should be <7% and that physicians should reevaluate the treatment regimen in patients with HbA1c values consistently >8%.)  Cholesterol, Serum 144  Triglycerides, Serum 101  HDL (INHOUSE)  36  VLDL Cholesterol Calculated  20  LDL Cholesterol Calculated 88 (Result(s) reported on 23 Aug 2013 at 05:36AM.)  12-Mar-15 04:50   Glucose, Serum  156  BUN  5  Creatinine (comp) 0.72  Sodium, Serum  132  Potassium, Serum 4.3  Chloride, Serum 101  CO2, Serum 26  Calcium (Total), Serum 8.5  Anion Gap  5  Osmolality (calc) 265  eGFR (African American) >60  eGFR  (Non-African American) >60 (eGFR values <68m/min/1.73 m2 may be an indication of chronic kidney disease (CKD). Calculated eGFR is useful in patients with stable renal function. The eGFR calculation will not be reliable in acutely ill patients when serum creatinine is changing rapidly. It is not useful in  patients on dialysis. The eGFR calculation may not be applicable to patients at the low and high extremes of body sizes, pregnant women, and vegetarians.)  Cardiac:  09-Mar-15 04:39   Troponin I < 0.02 (0.00-0.05 0.05 ng/mL or less: NEGATIVE  Repeat testing in 3-6 hrs  if clinically indicated. >0.05 ng/mL: POTENTIAL  MYOCARDIAL INJURY. Repeat  testing in 3-6 hrs if  clinically indicated. NOTE: An increase or decrease  of 30% or more on serial  testing suggests a  clinically important change)  CK, Total 47 (26-192 NOTE: NEW REFERENCE RANGE  07/19/2013)  CPK-MB, Serum 0.7 (Result(s) reported on 23 Aug 2013 at 06:15AM.)  Routine Coag:  12-Mar-15 04:50   Prothrombin 13.2  INR 1.0 (INR reference interval applies to patients on anticoagulant therapy. A single INR therapeutic range for coumarins is not optimal for all indications; however, the suggested range for most indications is 2.0 - 3.0. Exceptions to the INR Reference Range may include: Prosthetic heart valves, acute myocardial infarction, prevention of myocardial infarction, and combinations of aspirin and anticoagulant. The need for a higher or lower target INR must be assessed individually. Reference: The Pharmacology and Management of the Vitamin K  antagonists: the seventh ACCP Conference on Antithrombotic and Thrombolytic Therapy. CMMCRF.5436Sept:126 (3suppl): [id number] A HCT value >55% may artifactually increase the PT.  In one study,  the increase was an average of 25%. Reference:  "Effect on Routine and Special Coagulation Testing Values of Citrate Anticoagulant Adjustment in Patients with High HCT  Values." American Journal of Clinical Pathology 2006;126:400-405.)  Routine Hem:  09-Mar-15 04:39   WBC (CBC) 8.3  RBC (CBC) 3.98  Hemoglobin (CBC)  11.0  Hematocrit (CBC)  32.6  Platelet Count (CBC) 242  MCV 82  MCH 27.6  MCHC 33.6  RDW 14.2  Neutrophil % 61.3  Lymphocyte % 26.0  Monocyte % 9.7  Eosinophil % 2.1  Basophil % 0.9  Neutrophil # 5.1  Lymphocyte # 2.2  Monocyte # 0.8  Eosinophil # 0.2  Basophil # 0.1 (Result(s) reported on 23 Aug 2013 at 05:25AM.)  12-Mar-15 04:50   WBC (CBC) 9.8  RBC (CBC) 3.87  Hemoglobin (CBC)  10.7  Hematocrit (CBC)  31.6  Platelet Count (CBC) 210  MCV 82  MCH 27.5  MCHC 33.7  RDW 14.3  Neutrophil % 66.3  Lymphocyte % 18.3  Monocyte % 12.6  Eosinophil % 2.0  Basophil % 0.8  Neutrophil # 6.5  Lymphocyte # 1.8  Monocyte #  1.2  Eosinophil # 0.2  Basophil # 0.1 (Result(s) reported on 26 Aug 2013 at 05:29AM.)   PERTINENT RADIOLOGY STUDIES: CT:    09-Mar-15 16:32, CT Chest With Contrast  CT Chest With Contrast   REASON FOR EXAM:    mass/ pneumonia left hilum  COMMENTS:  PROCEDURE: CT  - CT CHEST WITH CONTRAST  - Aug 23 2013  4:32PM     CLINICAL DATA:  Abnormal chest radiograph    EXAM:  CT CHEST WITH CONTRAST    TECHNIQUE:  Multidetector CT imaging of the chest was performed during  intravenous contrast administration.    CONTRAST:  75 mL Isovue 370 IV  COMPARISON:  Chest radiograph dated 08/22/2013    FINDINGS:  8.0 x 8.3 x 5.7 cm mass in the medial left upper lobe, extending  into the mediastinum and left hilum, compatible with primary  bronchogenic neoplasm. Associated truncation of the left upper lobe  pulmonary artery (series 5/image 19). Mass abuts the chest wall  anteriorly (series 5/ image 17), although without definite  associated osseous involvement.    Mild associated surrounding opacity in the left lung apex (series 6/  image 13). Mild dependent atelectasis in the bilateral lower lobes.  No  pleural effusion or pneumothorax.    Visualized thyroid is unremarkable.  The heart is normal in size.  No pericardial effusion.    10 mm short axis right paratracheal node (series 5/ image 17),  suspicious. Small calcified subcarinal nodes (series 5/ image 22).  No suspicious axillary lymphadenopathy.    Visualized upper abdomen is notable for bilateral adrenal nodules,  measuring 3.0 x 2.3 cm on the right and 1.8 x 2.3 cm on the left  (series 5/ image 52), technically indeterminate but metastases not  excluded.    Degenerative changes of the visualized thoracolumbar spine.     IMPRESSION:  8.3 cm mass in the medial left upper lobe, extending into the  mediastinum and left hilum, compatible with primary bronchogenic  neoplasm. Mass abuts the left anterior chest wall without definite  osseous involvement.    10 mm short axis right paratracheal node, suspicious.    Bilateral adrenal nodules, measuring 3.0 cm on the right and 2.3 cm  on the left, metastases not excluded.      Electronically Signed    By: Julian Hy M.D.    On: 08/23/2013 17:07       Verified By: Julian Hy, M.D.,   Pertinent Past History:  Pertinent Past History COPD, not living on oxygen, hypertension, obstructive sleep apnea supposed to use CPAP, but could not tolerate it. Chronic diastolic congestive heart failure, hypertension, recent diagnosis of diabetes mellitus, on metformin.   Hospital Course:  Hospital Course * Lung mass  likely cancer- bronch and PET scan for further evaluation of tumour.    s/p bronch 08/26/13, PET can not be done due to bronch and biopsy + hyperglycemia.    May get PET scan as out pt.  * Chest pain in the midsternal area.     troponin and tele negative, cardio consult appreciated.    mediastenal/ lung mass on Xray     *  Acute cystitis. Urine cultures reviewed. The patient was on IV Rocephin.   * Pneumonia on the left side.  on IV Rocephin and Zithromax.  day 5 today.      * New onset diabetes mellitus with hyperglycemia and pseudohyponatremia. insulin sliding scale, IV fluids.       increased levemir. started on metformin.  *  Acute renal insufficiency, improved with IV fluids     * COPD- no exacerbations. called her daughter about plan,  Explained pt about the plan of discharge and getting PET scan as out pt, and follow ups in Dr. Ma Hillock 's office.  Condition on Discharge Stable   Code Status:  Code Status Full Code   DISCHARGE INSTRUCTIONS HOME MEDS:  Medication Reconciliation: Patient's Home Medications at Discharge:     Medication Instructions  advair diskus 250 mcg-50 mcg inhalation powder  1 puff(s) inhaled 2 times a day   metformin 500 mg oral tablet  1 tab(s) orally 2 times a day (with meals)   tramadol 50 mg oral tablet  1 tab(s) orally 1 to 4 times a day, As Needed - for Pain   insulin detemir 100 units/ml subcutaneous solution  20 unit(s) subcutaneous once a day   simvastatin 20 mg oral tablet  1 tab(s) orally once a day (at bedtime)   alprazolam 0.25 mg oral tablet  1 tab(s) orally every 8 hours, As Needed, anxiety , As needed, anxiety   metoprolol tartrate 25 mg oral tablet  1 tab(s) orally 2 times a day   protonix 40 mg oral delayed release tablet  1 tab(s) orally once a day    STOP TAKING THE FOLLOWING MEDICATION(S):    lisinopril 20 mg oral tablet: 2 tab(s) orally once a day  Physician's Instructions:  Home Health? Yes   [facility]  Nurse  social worker visit at home.   Home Health Instructions malignancy, new onset DM and on Insulin.   Diet Low Sodium  Carbohydrate Controlled (ADA) Diet   Activity Limitations As tolerated   Return to Work Not Applicable   Time frame for Follow Up Appointment 1-2 weeks  Dr. Ma Hillock   Other Comments PET scan as out pt and FOllow with Cancer center as out pt. Keep a record of Blood sugar daily twice and bring it to PMD in next visit.    Electronic Signatures: Vaughan Basta (MD)  (Signed 17-Mar-15 22:14)  Authored: ADMISSION DATE AND DIAGNOSIS, CHIEF COMPLAINT/HPI, Allergies, PERTINENT LABS, PERTINENT RADIOLOGY STUDIES, PERTINENT PAST HISTORY, HOSPITAL COURSE, DISCHARGE INSTRUCTIONS HOME MEDS, PATIENT INSTRUCTIONS   Last Updated: 17-Mar-15 22:14 by Vaughan Basta (MD)

## 2014-10-08 NOTE — Discharge Summary (Signed)
PATIENT NAME:  Deanna Santiago, Deanna Santiago MR#:  [id number] DATE OF BIRTH:  07-14-1950  DATE OF ADMISSION:  11/07/2013  DATE OF DISCHARGE:  11/08/2013  ADMITTING DIAGNOSIS: Chronic obstructive pulmonary disease exacerbation.   DISCHARGE DIAGNOSES: 1.  Chronic obstructive pulmonary disease exacerbation.  2.  Acute bronchitis. 3.  Dyspnea due to above.  4.  Hypotension.  5.  Hyponatremia, hypokalemia, mild dehydration, resolved on IV fluids. 6.  Cough, history of small cell lung carcinoma, hypertension, hyperlipidemia, gastroesophageal reflux disease, COPD, emphysema, chronic respiratory failure in the past, requiring oxygen therapy, former tobacco abuse, obstructive sleep apnea, obesity, and diastolic congestive heart failure.   DISCHARGE CONDITION: Stable.   DISCHARGE MEDICATIONS: The patient is to continue Claritin 10 mg p.o. daily as needed, Benadryl 25 mg p.o. at bedtime as needed, simvastatin 20 mg p.o. at bedtime, Protonix 40 mg p.o. daily, prednisone taper 50 mg p.o. once on 11/09/2013, then taper by 10 mg daily until stopped, albuterol/ipratropium 2.5/0.5 mg in 3 mL inhalation solution, 6 times daily as needed,  Combivent Respimat 100/20, 1 puff 4 times daily as needed, azithromycin 250 mg p.o. once daily for 4 more days, Tussionex 5 mL twice daily, guaifenesin 600 mg p.o. twice daily.   The patient is not to take metoprolol or HCTZ/lisinopril until recommended by primary care physician.   HOME OXYGEN: None.   DIET: 2 grams salt, low fat, low cholesterol, regular consistency.   ACTIVITY LIMITATIONS: As tolerated.   FOLLOWUP APPOINTMENT: With Dr. Maryellen PileEason in 2 days after discharge.    CONSULTANTS: Care Management, Social Work.   RADIOLOGIC STUDIES: Chest x-ray PA and lateral on 11/07/2013 revealed no acute cardiopulmonary process, pleural parenchymal scarring in the left lung apex consistent with treated lung malignancy, with tip of the right IJ approach single lumen port catheter is likely  just within the internal jugular vein at the confluence in the subclavian vein. interim she will remain confluence in the subclavian vein and subclavian vein.   The patient is a 64 year old African-American female with history of lung carcinoma, who presents to the hospital with complaints of shortness of breath for the past few days. Please refer to Dr. Eliane DecreePatel's admission note on 11/07/2013. On arrival to the hospital, patient was afebrile. Pulse was 96, blood pressure 122/71, O2 sats 99% on room air.   Physical exam revealed no audible wheezing, rales or rhonchi, but some coarse breath sounds. The patient was admitted to the hospital with diagnosis of COPD exacerbation, acute bronchitis. She was initiated on steroids as well as inhalation therapy. With this, her condition somewhat improved. On the day of discharge 11/08/2013, she felt satisfactory, did not complain of any significant discomfort. It was felt that patient is to continue steroid taper as well as antibiotic therapy for the next 4 days and inhalation therapy via nebulizer. In regards to hypotension, which patient was noted on arrival to the hospital, it was felt that patient's hypotension was likely dehydration-related. The patient was rehydrated, and her sodium level as well as potassium level were checked. Unfortunately, patient's sodium level remained somewhat lower at 134. However, her potassium level normalized to 3.6. It was felt that patient's low sodium level could have been related to HCTZ as well as possibly SIADH related lung disease. It is recommended to follow the patient's sodium level as outpatient, and make decisions about evaluation further if needed.  In regards to cough, the patient was given medications to combat cough.   For history of small cell lung carcinoma, history  of hypertension, hyperlipidemia, gastroesophageal reflux disease, also obstructive sleep apnea, obesity, diastolic CHF, patient is to continue her outpatient  management. Since patient was a little hypotensive on arrival to the Emergency Room, it was felt that patient's metoprolol as well as HCTZ/lisinopril should be placed on hold until she is seen by her primary care physician. The patient, however, was advised to resume this medication if her systolic blood pressure is above 140. On the day of discharge, 11/08/2013, vital signs were temperature 98.3, pulse was 97, respiration rate was 20, blood pressure 114/67, saturation was 94% to 97% on room air at rest as well as on exertion.   TIME SPENT: 40 minutes.      ____________________________ Katharina Caper, MD rv:mr D: 11/08/2013 19:23:00 ET Santiago: 11/08/2013 20:51:39 ET JOB#: [id number]  cc: Serita Sheller. Maryellen Pile, MD Katharina Caper, MD, <Dictator>   Jaquon Gingerich MD ELECTRONICALLY SIGNED 11/28/2013 20:54

## 2014-10-08 NOTE — Op Note (Signed)
PATIENT NAME:  Deanna Santiago, [name]:  [id number] DATE OF BIRTH:  [birth year]  DATE OF PROCEDURE:  09/07/2013  ATTENDING PHYSICIAN:  Cristal Deerhristopher A. Dhyan Noah, MD  PREOPERATIVE DIAGNOSIS: Lung cancer, need for intravenous access for chemotherapy.   POSTOPERATIVE DIAGNOSIS:  Lung cancer, need for intravenous access for chemotherapy.   PROCEDURE:  Ultrasound-guided right internal jugular tunneled Port-A-Cath placement.   ESTIMATED BLOOD LOSS: 15 mL.   SPECIMENS: None.   ANESTHESIA: MAC 1% local lidocaine.   INDICATIONS FOR SURGERY: Ms. Deanna Santiago is a pleasant 64 year old female with history of newly-discovered lung cancer, who was in need for Port-A-Cath for chemoradiation.  I was thus asked to place a Port-A-Cath in the patient and she deemed to be a suitable candidate.   DETAILS OF PROCEDURE: Informed consent was obtained.  Ms. Deanna Santiago was brought to the Operating Room suite. She was placed on the operating room table.  She was given IV sedation.  Her right neck and chest was prepped and draped in the standard sterile fashion.  A timeout was then performed correctly identifying patient name, operative site and procedure to be performed.  An ultrasound was used to isolate her right internal jugular vein. It was quite small and, due to her volume status and deep breathing, it collapsed easily. I was able to access it with 2 sticks of the wire. The wire was placed under direct fluoroscopic visualization. The needle was then removed. A place on her right chest was chosen for the incision to make a cavity for the Port-A-Cath. I deepened this down to the pectoral fascia. The cavity was fashioned using blunt dissection. I then used the tunneler to tunnel the catheter from the port pocket to the incision made where the wire is on the patient's right neck. The Port-A-Cath was then sutured into the fascia but not tied down. The bilateral lumen were placed over the previously-placed wire. The dilator and sheath were  then placed over the wire. The dilator and wire were removed. The catheter was then placed in the sheath to the cavoatrial junction. The dilator was then removed. The Port-A-Cath was then connected to the catheter and sutured into the chest.   The cavity was then irrigated and noted to be hemostatic and the small neck incision was closed with a single 3-0 Vicryl interrupted deep dermal suture. The pocket was closed with 3-0 Vicryl, 2 layers of 3-0 Vicryl deep dermal interrupted and 4-0 Monocryl subcuticular. Dermabond was then placed over the wound. All drapes were then taken down. She was awoken and brought to the postanesthesia care unit. There were no immediate complications. Needle, sponge and instrument counts were correct at the end of the procedure. Postoperative x-ray showed no pneumothorax. The catheter did appear to have retracted slightly into the superior vena cava, however, the port appeared to be lower than initially planned and I feel that the catheter is in appropriate position and will be suitable for chemotherapy and position of x-ray may artificially make catheter look more retracted than it actually is.     ____________________________ Si Raiderhristopher A. Harini Dearmond, MD cal:cs D: 09/08/2013 13:30:27 ET T: 09/08/2013 20:04:38 ET JOB#: [id number]  cc: Cristal Deerhristopher A. Leone Mobley, MD, <Dictator> Jarvis NewcomerHRISTOPHER A Beronica Lansdale MD ELECTRONICALLY SIGNED 09/15/2013 12:24

## 2014-10-08 NOTE — Discharge Summary (Signed)
PATIENT NAME:  Deanna Santiago, Deanna Santiago MR#:  [id number] DATE OF BIRTH:  01/14/1951  DATE OF ADMISSION:  10/25/2013 DATE OF DISCHARGE:  10/27/2013  DISCHARGE DIAGNOSES: 1.  Acute renal insufficiency, improved.  2.  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   HISTORY OF PRESENT ILLNESS: The patient is a 64 year old female with known history of metastatic small cell lung cancer including brain metastases, status post palliative radiation and on chemotherapy with carboplatin VP-16. The [facility] for chemotherapy but was found to have a markedly abnormal creatinine of 3.23. She stated that she had not been drinking enough fluids, also had a CT scan with contrast done on May 4th. In addition, she had some diarrhea after last chemotherapy which currently had resolved. The patient was admitted to hospital for further care. She denied any fever or chills. No new shortness of breath, orthopnea or PND. No bleeding issues. She was eating fair.   For past medical history, social history, medications, review of systems, physical exam and labs please refer to history and physical note for details.   HOSPITAL COURSE: The patient was admitted to the oncology floor. Labs on admission showed creatinine 3.23, potassium 3.0, calcium 8.2 LFTs unremarkable. Hemoglobin 9.5, platelets 319. She was initially given 2 lit[facility] a [facility], then was continued on aggressive IV hydration at 135 mL per hour half normal saline as inpatient. Intake and output was monitored along with electrolytes. Hypokalemia was supplemented with oral potassium. On May 12th, creatinine was improving well at 1.53. The patient began to eat better. On May 13th, creatinine had normalized at 0.84, BUN was 8, calcium 7.9. The patient was eating much better. She also had felt much better clinically. Given elevated creatinine, metformin, lisinopril/HCTZ was held upon admission. Blood  sugars monitored during hospitalization consistently remained less than 100. Therefore, the patient was advised to hold her metformin upon discharge, also monitor Accu-Chek at home and resume metformin if blood sugar consistently rose above 140. She was also advised to take 1/2 tablet lisinopril/HCTZ per day for the hypertension. She was advised that there was likely mostly prerenal component and likely clinical dehydration causing the acute renal insufficiency and was strongly encouraged to increase water intake at home, stated that she would do so. Chemotherapy was held this week due to acute issues. Plan was to follow up next week and resume on treatment if she was doing well.   DISCHARGE MEDICATIONS:  Simvastatin 20 mg at bedtime, Xanax 0.25 mg q.8 hours p.r.n., metoprolol 25 mg b.i.d., Protonix 40 mg daily, HCTZ/lisinopril 12.5 mg/20 mg half tablet once daily, Percocet 5/325 mg 1 to 2 tablets q.4 to 6 hours  p.r.n., ondansetron 4 mg q.4 to 6 hours p.r.n.   DISCHARGE DIET: Low sodium, diabetic diet.   ACTIVITY: As tolerated. Use walker or a cane for ambulation/support.   FOLLOWUP: [facility] Dr. Sherrlyn Santiago on 11/01/2013 with labs and plan next dose of chemotherapy.   ____________________________ Maren ReamerSandeep R. Sherrlyn HockPandit, MD srp:cs D: 10/27/2013 15:46:23 ET Santiago: 10/27/2013 19:56:58 ET JOB#: [id number]  cc: Deanna Santiago R. Sherrlyn HockPandit, MD, <Dictator> Wille CelesteSANDEEP R Isami Mehra MD ELECTRONICALLY SIGNED 10/29/2013 16:25

## 2014-10-08 NOTE — Consult Note (Signed)
PATIENT NAME:  Deanna Santiago, Deanna Santiago MR#:  [id number] DATE OF BIRTH:  02/12/1951  ONCOLOGY CONSULTATION   DATE OF CONSULTATION:  08/24/2013  REFERRING PHYSICIAN:  Hope PigeonVaibhavkumar G. Elisabeth PigeonVachhani, MD CONSULTING PHYSICIAN:  Terry Abila R. Sherrlyn HockPandit, MD  REASON FOR CONSULTATION: Lung mass.   HISTORY OF PRESENT ILLNESS: The patient is a 64 year old female with past medical history significant for smoking, COPD, hypertension, obstructive sleep apnea, chronic diastolic congestive heart failure, hypertension, recent diagnosis of diabetes mellitus, hysterectomy, who has been admitted to hospital with chest pain and elevated blood sugar. Accu-Chek was 457 blood sugar and then went up to 530. Pain was in the midsternal area with no radiation, there was some nausea associated. She denied any new shortness of breath, but has chronic dyspnea on exertion. States that she remains physically active. Currently, chest pain is doing better. She had CT scan of the chest with contrast on March 9th which showed an 8.3 cm mass in the medial left upper lobe extending into the mediastinum and left hilum, also abuts the left anterior chest wall, without definite osseous involvement, 10 mm right paratracheal node, bilateral adrenal nodules measuring 3 cm on the right and 2.3 cm on the left. The patient denies any hemoptysis. No changes in appetite or unintentional weight loss. No new bone pains. Currently, denies any headaches, imbalance or falls.   PAST MEDICAL HISTORY AND PAST SURGICAL HISTORY: As in HPI above.   FAMILY HISTORY: Remarkable for stroke, hypertension, kidney disease. Denies malignancy.   SOCIAL HISTORY: Chronic smoker, quit in November 2013. Lives at home with her son, who she states has medical issues, and she takes care of him. Denies alcohol or recreational drug usage. Physically, has been active.   ALLERGIES: INCLUDE PHENERGAN.   HOME MEDICATIONS:  1. Advair 1 puff b.i.d. 2. Lisinopril 40 mg daily.  3. Metformin 500 mg  b.i.d.   REVIEW OF SYSTEMS:  CONSTITUTIONAL: As in HPI. Denies any fevers, chills or night sweats.  HEENT: No new headaches, dizziness, epistaxis, ear or jaw pain. No sinus symptoms.  CARDIAC: Denies any palpitations, orthopnea or PND.  LUNGS: As in HPI.  GASTROINTESTINAL: No nausea, vomiting or diarrhea. No bright red blood in stools or melena.  GENITOURINARY: No dysuria or hematuria.  SKIN: No new rashes or pruritus.  HEMATOLOGIC: No obvious bleeding issues.  EXTREMITIES: No new swelling or pain.  NEUROLOGIC: No new focal weakness, seizures or loss of consciousness.  ENDOCRINE: No polyuria or polydipsia.   PHYSICAL EXAMINATION:  GENERAL: The patient is a moderately built and well-nourished individual, sitting in bed, alert and oriented x4 and converses appropriately. No acute distress at rest. No icterus. No pallor.  VITAL SIGNS: 98.2, 100, 18, 129/80, 95% on room air.  HEENT: Normocephalic, atraumatic. Extraocular movements intact. Sclerae anicteric. No oral thrush.  NECK: Negative for lymphadenopathy.  CARDIOVASCULAR: S1, S2, regular rate and rhythm.  LUNGS: Show bilateral good air entry, diminished at bases and left upper lobe area. No rhonchi.  ABDOMEN: Soft. No hepatosplenomegaly clinically.  EXTREMITIES: Show no major edema or cyanosis.  SKIN: Shows no generalized rashes or major bruising.  NEUROLOGIC: Cranial nerves intact. Moves all extremities spontaneously.  MUSCULOSKELETAL: No obvious joint redness or swelling.   LABORATORY RESULTS: Hemoglobin 11.5, platelets 220, WBC 8800, ANC 6200. Creatinine 0.8, potassium 3.8, calcium 9.0. INR 1.0. Recent LFT unremarkable except albumin 2.7 and low transaminases. Blood culture negative.   IMPRESSION AND RECOMMENDATIONS: A 64 year old female with history of chronic smoking, chronic obstructive pulmonary disease, sleep apnea,  diastolic congestive heart failure, hypertension and chronic smoking, quit in November 2013, who has been admitted  with atypical chest pain and high blood sugar. Workup with CT scan of the chest shows a large 8.3 cm left upper lobe mass abutting the chest wall and extending into the hilum/mediastinum. Also, there are bilateral adrenal nodules suspicious for metastatic disease. The patient explained about findings on CT scan and that it raises strong suspicion for metastatic lung cancer and that she will need further workup. The patient has been seen by pulmonology, Dr. Belia Heman, and is planned for endobronchial ultrasound bronchoscopy on Thursday. Will also proceed with PET scan for lung cancer diagnosis and staging. The patient explained that once workup is completed and tissue diagnosis is obtained, will return and discuss treatment planning, have outlined general treatment approach for different types of lung cancer at this time. No major pain issues currently. Continue current supportive treatment. I have encouraged her to continue to eat well. The patient is agreeable to this plan.   Thank you for the referral. Please feel free to contact me for additional questions.   ____________________________ Maren Reamer Sherrlyn Hock, MD srp:lb D: 08/25/2013 08:33:00 ET T: 08/25/2013 09:16:08 ET JOB#: [id number]  cc: Delita Chiquito R. Sherrlyn Hock, MD, <Dictator> Wille Celeste MD ELECTRONICALLY SIGNED 08/29/2013 9:33

## 2014-10-08 NOTE — H&P (Signed)
PATIENT NAME:  Deanna Santiago, Deanna Santiago MR#:  [id number] DATE OF BIRTH:  1950/09/04  DATE OF ADMISSION:  08/23/2013  PRIMARY CARE PHYSICIAN: Dr. Dennison Mascot   CHIEF COMPLAINT: Chest pain and elevated blood glucose.   HISTORY OF PRESENT ILLNESS: The patient is 64 year old African American female recently diagnosed with diabetes mellitus approximately one week ago, has been experiencing burning type of chest pain since last night. The patient describes it like indigestion. It is in the midsternal area with no radiation, but associated with nausea. It is intermittent in nature and swallowing ice makes it feel better. No radiation. Denies any association of the shortness of breath. Sometimes feels lightheaded, but denies any loss of consciousness. Denies any cough, fever or sick contacts. She was seen by some cardiologist in the past and had a stress test done several years ago, which was normal. Chest x-ray has revealed opacity in the left hilum, anterior medial left upper lobe, which could mostly reflect pneumonia. UA is positive for UTI. Accu-Cheks have revealed 457 blood sugar, eventually it went up to 530 at 19 hours. The patient has received 10 units of IV insulin following which her Accu-Chek went down to 191 at 2155. During my examination, the patient is feeling dry and asking for ice chips. No family members at bedside. Denies any chest pain during my examination. Initial troponin is negative. The patient has received IV Rocephin in the ER and 2 liters of fluid boluses given in the ER. The patient also has received aspirin in the ER. Denies any weight loss or weight gain recently.   PAST MEDICAL HISTORY: COPD, not living on oxygen, hypertension, obstructive sleep apnea supposed to use CPAP, but could not tolerate it. Chronic diastolic congestive heart failure, hypertension, recent diagnosis of diabetes mellitus, on metformin.   PAST SURGICAL HISTORY: Hysterectomy.   ALLERGIES: PHENERGAN.    PSYCHOSOCIAL HISTORY: Lives at home with son. She used to smoke, but quit smoking in November 2013. Denies alcohol or illicit drug usage.   FAMILY HISTORY: Positive for stroke, hypertension, along with renal failure.   HOME MEDICATIONS: Metformin 500 mg p.o. 2 times a day, Advair 1 puff inhalation 2 times a day, lisinopril 20 mg 2 tablets p.o. once daily.   REVIEW OF SYSTEMS:  CONSTITUTIONAL: Denies any fever or fatigue, weight loss or weight gain.  EYES: Denies blurry vision, double vision.  ENT: Denies epistaxis, discharge.  RESPIRATION: Denies cough, has chronic history of COPD, obstructive sleep apnea.  CARDIOVASCULAR: Complaining of chest pain in the midsternal area, feels like indigestion, has hypertension, denies any syncope or palpitations.  GASTROINTESTINAL: Denies nausea, vomiting, diarrhea, abdominal pain.  GENITOURINARY: No dysuria or hematuria.  ENDOCRINE: Denies polyuria or nocturia, but recently diagnosed with diabetes mellitus six days ago. HEMATOLOGIC: No anemia, easy bruising, bleeding.  INTEGUMENTARY: No acne, rash, lesions.  MUSCULOSKELETAL: Denies any back pain or hip pain.  NEUROLOGICAL: Denies vertigo, ataxia, TIA, dysarthria.  PSYCHIATRIC: Denies any anxiety, depression, bipolar disorder.   PHYSICAL EXAMINATION:  VITAL SIGNS: Temperature 97.7, pulse 91, respirations 22, blood pressure 134/71,  pulse ox 94% on 2 liters.  GENERAL APPEARANCE: Not in acute distress. Moderately built and obese.  HEENT: Normocephalic, atraumatic. Pupils are equally reactive in light and accommodation. Extraocular movements are intact. Dry mucous membranes. No postnasal drip.  NECK: Supple. No JVD. No thyromegaly. No carotid bruits. Range of motion is intact.  LUNGS: Decreased breath sounds on left side of the chest: Moderate air entry on the right side, positive crackles  in the left side of the chest. No wheezing. No accessory muscle use and no anterior chest wall tenderness on  palpation. CARDIOVASCULAR:  Tachycardic. No murmurs. Point of maximum impulse is not lateralized. No anterior chest wall tenderness. GASTROINTESTINAL: Soft, obese. Bowel sounds are positive in all four quadrants. Nontender, nondistended. No hepatosplenomegaly. No masses felt.  NEUROLOGIC: Awake, alert, oriented x 3. Cranial nerves II through XII are grossly intact. Motor and sensory are intact. Reflexes are 2+.   EXTREMITIES: No edema. No cyanosis. No clubbing. Good pedal pulses, good  femoral pulses.  PSYCHIATRIC: Normal mood and affect.  SKIN: Warm and dry in nature.  MUSCULOSKELETAL: No joint effusion, tenderness, erythema.   LABORATORIES AND IMAGING STUDIES: A 12-lead EKG: Sinus tachycardia at 92 beats per minute, left ventricular hypertrophy, T wave inversions are noted in V5 to V6.  No acute ST-T wave changes noticed.   Troponin less than 0.2. CBC is normal.    Urinalysis: Yellow in color, cloudy in appearance, glucose greater than 500 and nitrites are negative, leukocyte esterase 2+.    LFTs: alkaline phosphatase is elevated  at 133, the rest of them are normal.  BMP: Serum glucose is 481,  BUN 19, creatinine 1.54, sodium 123, potassium 3.6, chloride 89, CO2 28. Anion gap is 8, GFR 41, serum osmolality 276, calcium is 10.5.   Chest x-ray PA and lateral views: Masslike opacity   in the left hilum and  in the anterior medial left upper lobe. Although this could reflect focal pneumonia, neoplastic disease is suspected. Recommend follow-up CT chest with contrast.  ASSESSMENT AND PLAN: A 64 year old [place] American female recently just recently diagnosed with new onset diabetes mellitus, one week ago, is presenting with chest pain and high blood sugars.  1. Chest pain in the midsternal area. We will admit her to telemetry. We will implement ACS protocol with oxygen, nitroglycerin, aspirin, beta blocker and statin.  2. Cycle cardiac biomarkers.  3. Cardiology consult is placed to Dr.  Darrold Junker. Not considering stress test at this time as the patient is acutely sick with pneumonia and cystitis.  4. Acute cystitis. Urine cultures are ordered. The patient will be on IV Rocephin.  5. Pneumonia on the left side. We will get sputum cultures, blood cultures, The patient will be on IV Rocephin and Zithromax.  6. CT chest with contrast deferred at this time in view of renal insufficiency. Rounding physician to consider CT chest with contrast as recommended by the radiologist once renal function is improved to rule out underlying malignancy. 7. New onset diabetes mellitus with hyperglycemia and pseudohyponatremia. We will provide her insulin sliding scale, IV fluids. Hold metformin in view of acute renal insufficiency. We will check hemoglobin A1c. We will provide diabetic diet education. 8. Acute renal insufficiency, probably from dehydration with decreased p.o. intake, underlying etiology could not be excluded. We will provide her IV fluids.  9. Avoid  nephrotoxins.  10. Hold ACE inhibitor and metformin until renal function is improved.  11. Obstructive sleep apnea. The patient currently is not using CPAP as she could not tolerate  CPAP mask.  12. Chronic history of chronic obstructive pulmonary disease, not in exacerbation. We will provide her DuoNeb nebulizer treatments q.6h. as needed.  13. Chronic history of chronic diastolic congestive heart failure. Not fluid overloaded at this time. We will monitor for symptoms and signs of fluid overload. The patient is not on Lasix. We will follow up with Dr. Darrold Junker regarding this.  14. We will provide  gastrointestinal prophylaxis with Protonix and deep vein thrombosis prophylaxis with heparin subcutaneous.   CODE STATUS: SHE IS FULL CODE. Her son and daughter are the medical POA.   Diagnoses and plan of care was discussed in detail with the patient. She is aware of the plan.   Total time spent on admission is 50 minutes.      ____________________________ Ramonita LabAruna Elzy Tomasello, MD ag:sg D: 08/23/2013 00:05:00 ET T: 08/23/2013 06:19:46 ET JOB#: [id number]  cc: Ramonita LabAruna Cathline Dowen, MD, <Dictator> Dennison MascotLemont Morrisey, MD  Ramonita LabARUNA Reyanna Baley MD ELECTRONICALLY SIGNED 09/16/2013 2:44

## 2014-10-08 NOTE — H&P (Signed)
PATIENT NAME:  Deanna Santiago, Deanna Santiago MR#:  [id number] DATE OF BIRTH:  1950-06-22  DATE OF ADMISSION:  10/25/2013   CHIEF COMPLAINT AND REASON FOR ADMISSION:  Acute renal insufficiency inpatient with metastatic small cell lung cancer.   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 She also has history of COPD, diabetes mellitus, hypertension, obstructive sleep apnea, chronic diastolic congestive heart failure and hysterectomy. The patient was seen at the [facility] today for scheduled appointment to plan cycle 3 chemotherapy. She had CT scan done with contrast 1 week ago to restage cancer which shows response to chemotherapy. Serum creatinine, however, has markedly elevated from 1.46 on 05/04 to 3.23 today. BUN is 23. The patient states that she had mild diarrhea after last chemotherapy but this has improved. She has had fairly good appetite but states that she probably does not take adequate amount of fluids orally at home. She denies any dizziness, progressive weakness or loss of consciousness. She has chronic fatigue and mild dyspnea on exertion which is unchanged. No bleeding issues. No fevers or chills.   PAST MEDICAL HISTORY AND PAST SURGICAL HISTORY: As in HPI above.   ALLERGIES: INCLUDE PHENERGAN CAUSES ITCHING.   HOME MEDICATIONS: Tramadol 50 mg q.i.d. p.r.n., simvastatin 20 mg at bedtime, Protonix 40 mg daily, Zofran 4 mg q. 4 to 6 hours p.r.n. for nausea, metoprolol tartrate 25 mg b.i.d., metformin 500 mg b.i.d., HCTZ/lisinopril 12.5 mg/20 mg once daily, Xanax 0.25 mg q.8 hours p.r.n., Percocet 5/325 mg 1 to 2 tablets q. 4 to 6 hours p.r.n.   REVIEW OF SYSTEMS: CONSTITUTIONAL: As in HPI. No fevers or chills.  No night sweats.  HEENT: Denies any headaches or dizziness at rest. No epistaxis, ear or jaw pain. No sinus symptoms.  CARDIAC: No angina, palpitation, orthopnea or PND.  LUNGS: Has chronic mild dyspnea on exertion, mild cough, no sputum or hemoptysis.  GASTROINTESTINAL: Currently no nausea, vomiting or diarrhea. No bright red blood in stools or melena.  GENITOURINARY: No dysuria or hematuria.  SKIN: No new rashes or pruritus.  MUSCULOSKELETAL: Chronic arthritis. Denies new bone pains.  EXTREMITIES: No new swelling or pain.  NEUROLOGIC: No new focal weakness, seizures or loss of consciousness. ENDOCRINE: No polyuria or polydipsia. Appetite is fair.   PHYSICAL EXAMINATION: GENERAL: The patient is moderately built and well-nourished individual, sitting in chair without any acute distress, alert and oriented x 4 and converses appropriately. No icterus. Mild pallor present.  VITAL SIGNS: 98.6, 97, 20, 115/73, pulse 100% on room air.  HEENT: Normocephalic, atraumatic. Extraocular movements intact. Sclerae anicteric. No oral thrush. Mouth is dry.  NECK: Negative for lymphadenopathy.  CARDIOVASCULAR: S1 and S2, regular rate and rhythm.  LUNGS: Show bilateral diminished breath sounds overall. No crepitations or rhonchi noted.  ABDOMEN: Soft, nontender. No hepatomegaly or masses clinically. Bowel sounds present.  EXTREMITIES: Shows trace edema. No cyanosis or calf tenderness.  SKIN: Shows no generalized rashes or major bruising.  NEUROLOGIC:  Grossly nonfocal, cranial nerves intact.  MUSCULOSKELETAL: No obvious joint deformity or redness.   LABORATORY RESULTS: Hemoglobin 9.5, platelets 319,  Creatinine 3.23, potassium 3.0, calcium 8.2. LFTs unremarkable.   IMPRESSION AND PLAN: 1.  Acute renal insufficiency, likely multifactorial, given that she has stage IV lung cancer and is  on chemotherapy with etoposide/carboplatin, mild diarrhea after last chemotherapy, decreased oral fluid intake. CT scan  with contrast done last week. Given poor oral fluid intake, I have advised the patient that we will need to hospitalize and start on aggressive IV hydration and, if renal functions do not improve, we will then need to consider renal ultrasound and nephrology consult tomorrow. We will need to hold medications that could affect renal insufficiency including metformin, HCTZ/lisinopril. Continue to monitor intake and output. We will repeat metabolic panel in morning.  2.  Stage IV small cell lung cancer with brain metastases status post palliative radiation, adrenal metastasis. The patient is status post 2 cycles of carboplatin/etoposide chemotherapy. CT scan done last week as indicated with response to treatment. Given ongoing renal insufficiency issues, chemotherapy is on hold until acute issues improve/resolve.  3.  Diabetes mellitus. We will hold metformin, place on sliding scale insulin for now and monitor.  4.  Hypertension. We will hold HCTZ/lisinopril. Continue metoprolol 25 mg b.i.d., we will add hydralazine 25 mg q.8 hours and monitor blood pressure.  5.  Pain -- adequately controlled. Continue Percocet p.r.n.  6.  The patient explained above details. She is agreeable to this plan.   ____________________________ Maren ReamerSandeep R. Sherrlyn HockPandit, MD srp:ce D: 10/25/2013 17:28:51 ET T: 10/25/2013 17:39:26 ET JOB#: [id number]  cc: Lateefah Mallery R. Sherrlyn HockPandit, MD, <Dictator> Wille CelesteSANDEEP R Grenda Lora MD ELECTRONICALLY SIGNED 10/26/2013 1:04

## 2014-10-08 NOTE — Consult Note (Signed)
Reason for Visit: This 64 year old Female patient presents to the clinic for initial evaluation of  stage IV small cell lung cancer .   Referred by Dr Sherrlyn Hock.  Diagnosis:  Chief Complaint/Diagnosis   64 year old female with known large left upper lobe small cell lung cancer with brain metastasis now for whole brain radiation therapy  Pathology Report pathology report reviewed   Imaging Report head CT and CT of chest reviewed   Referral Report clinical notes reviewed   Planned Treatment Regimen palliative whole brain radiation   HPI   patient is a 64 year old female with significant past medical history of lifetime smoking, COPD hypertension congestive heart failure, adult onset diabetes who presented to the emergency room with chest pain and elevated blood sugar. CT scan revealed an 8.3 cm mass in the left upper lobe extending to the mediastinum and left hilum. She had bilateral adrenal nodules measuring 3 cm right 2.3 cm on the left. bronchoscopy was performed and biopsy was positive for small cell undifferentiated carcinomaShe had a port placed in anticipation of systemic chemotherapy. CT scan of the brain was performed with and without contrast showing at least 5 metastatic lesions in the brain the largest being 2.2 cm only mild cerebral edema was noted.I been asked to evaluate the patient for palliative radiotherapy radiation therapy to the whole brain. She is quite upset today recently having received the news of her brain lesions. She's having no specific neurologic complaints no change in visual fields no motor or sensory changes  Past Hx:    Hypertension:    COPD:    Asthma:    Hysterectomy - Total:   Past, Family and Social History:  Past Medical History positive   Cardiovascular congestive heart failure; hypertension   Respiratory asthma; COPD   Endocrine diabetes mellitus   Past Surgical History hysterectomy   Family History positive   Family History Comments  family history for hypertension CVA kidney disease.   Social History positive   Social History Comments greater than 40-pack-year smoking history quit smoking in November of 2013 no EtOH use history.   Additional Past Medical and Surgical History accompanied by her daughter today   Allergies:   Phenergan: Itching  Home Meds:  Home Medications: Medication Instructions Status  acetaminophen-HYDROcodone 325 mg-5 mg oral tablet 1 tab(s) orally every 6 hours, As needed, pain Active  Protonix 40 mg oral delayed release tablet 1 tab(s) orally once a day Active  traMADol 50 mg oral tablet 1 tab(s) orally 1 to 4 times a day, As Needed - for Pain Active  metoprolol tartrate 25 mg oral tablet 1 tab(s) orally 2 times a day Active  ALPRAZolam 0.25 mg oral tablet 1 tab(s) orally every 8 hours, As Needed, anxiety , As needed, anxiety Active  simvastatin 20 mg oral tablet 1 tab(s) orally once a day (at bedtime) Active  insulin detemir 100 units/mL subcutaneous solution 20 unit(s) subcutaneous once a day Active  metFORMIN 500 mg oral tablet 1 tab(s) orally 2 times a day (with meals) Active   Review of Systems:  General negative   Performance Status (ECOG) 1   Skin negative   Breast negative   Ophthalmologic negative   ENMT negative   Respiratory and Thorax see HPI   Cardiovascular see HPI   Gastrointestinal negative   Genitourinary negative   Musculoskeletal negative   Neurological negative   Psychiatric negative   Hematology/Lymphatics negative   Endocrine negative   Allergic/Immunologic negative   Review of  Systems   review of systems obtained from nurses notes  Physical Exam:  General/Skin/HEENT:  Skin normal   Eyes normal   ENMT normal   Head and Neck normal   Additional PE well-developed obese female wheelchair bound in NAD quite tearful. No cervical or supraclavicular adenopathy is appreciated lungs are clear to A&P cardiac examination shows regular rate  and rhythm. Cranial nerves II through XII are grossly intact. Visual fields within normal range. Proprioception is intact, motor sensory and DTR levels are equal and symmetric in the upper and lower extremities.fundi are benign on examination. PERR LA,   Breasts/Resp/CV/GI/GU:  Respiratory and Thorax normal   Cardiovascular normal   Gastrointestinal normal   Genitourinary normal   MS/Neuro/Psych/Lymph:  Musculoskeletal normal   Neurological normal   Lymphatics normal   Other Results:  Radiology Results: LabUnknown:    09-Mar-15 16:32, CT Chest With Contrast  PACS Image     25-Mar-15 13:05, CT Head WWO Contrast  PACS Image   CT:    09-Mar-15 16:32, CT Chest With Contrast  CT Chest With Contrast   REASON FOR EXAM:    mass/ pneumonia left hilum  COMMENTS:       PROCEDURE: CT  - CT CHEST WITH CONTRAST  - Aug 23 2013  4:32PM     CLINICAL DATA:  Abnormal chest radiograph    EXAM:  CT CHEST WITH CONTRAST    TECHNIQUE:  Multidetector CT imaging of the chest was performed during  intravenous contrast administration.    CONTRAST:  75 mL Isovue 370 IV  COMPARISON:  Chest radiograph dated 08/22/2013    FINDINGS:  8.0 x 8.3 x 5.7 cm mass in the medial left upper lobe, extending  into the mediastinum and left hilum, compatible with primary  bronchogenic neoplasm. Associated truncation of the left upper lobe  pulmonary artery (series 5/image 19). Mass abuts the chest wall  anteriorly (series 5/ image 17), although without definite  associated osseous involvement.    Mild associated surrounding opacity in the left lung apex (series 6/  image 13). Mild dependent atelectasis in the bilateral lower lobes.  No pleural effusion or pneumothorax.    Visualized thyroid is unremarkable.  The heart is normal in size.  No pericardial effusion.    10 mm short axis right paratracheal node (series 5/ image 17),  suspicious. Small calcified subcarinal nodes (series 5/ image 22).  No  suspicious axillary lymphadenopathy.    Visualized upper abdomen is notable for bilateral adrenal nodules,  measuring 3.0 x 2.3 cm on the right and 1.8 x 2.3 cm on the left  (series 5/ image 52), technically indeterminate but metastases not  excluded.    Degenerative changes of the visualized thoracolumbar spine.     IMPRESSION:  8.3 cm mass in the medial left upper lobe, extending into the  mediastinum and left hilum, compatible with primary bronchogenic  neoplasm. Mass abuts the left anterior chest wall without definite  osseous involvement.    10 mm short axis right paratracheal node, suspicious.    Bilateral adrenal nodules, measuring 3.0 cm on the right and 2.3 cm  on the left, metastases not excluded.      Electronically Signed    By: Charline Bills M.D.    On: 08/23/2013 17:07       Verified By: Charline Bills, M.D.,    25-Mar-15 13:05, CT Head WWO Contrast  CT Head WWO Contrast   REASON FOR EXAM:    Small cell  Lung Ca  eval for brain  mets  COMMENTS:       PROCEDURE: CT  - CT HEAD W/WO  - Sep 08 2013  1:05PM     CLINICAL DATA:  64 year old female with small cell lung cancer.  Generalized weakness. Nausea. Staging. Subsequent encounter.    EXAM:  CT HEAD WITHOUT AND WITH CONTRAST    TECHNIQUE:  Contiguous axial images were obtained from the base of the skull  through the vertex without and with intravenous contrast  CONTRAST:  75 mL Isovue-300.    COMPARISON:  None.    FINDINGS:  Suggestion of an abnormally rounded right suboccipital lymph node  measuring 8 mm on series 2, image 2.    Visualized orbits and other scalp soft tissues are within normal  limits. Visualized paranasal sinuses and mastoids are clear. No  osseous abnormality identified.    Mild Calcified atherosclerosis at the skull base.    Multiple hyperdense enhancing brain masses. There is a small  hyperdense fluid level in a rim enhancing left anterior frontal lobe  mass (series 2  and series 4, image 17) which might be related to a  small amount of lesion hemorrhage. 18-22 mm diameter masses involves  the medial right thalamus and medial right occipital lobe on series  4, image 13. There is a 7 mm mass in the head of the right caudate  nucleus on the same image. There is a small rim enhancing lesion in  the left superior frontal gyrus on series 4, image 20. Difficult to  exclude a 3 mm enhancing mass in the right cerebellum on series 4,  image 6. There could also be a small 3-4 mm metastasis atthe right  superior frontal gyrus on image 24.    Despite the number of masses only trace leftward midline shift  occurs and there is minimal cerebral edema. No ventriculomegaly. No  extra-axial hemorrhage. No superimposed acute cortically based  infarct.   IMPRESSION:  1. Positive for cerebral metastatic disease. At least 5 brain  metastases, the largest 22 mm diameter. Some lesions may have  associated micro hemorrhage.  2. Minimal cerebral edema and only mild leftward midline shift. No  ventriculomegaly or impending herniation.  3. Questionable right suboccipital metastatic lymph node, abnormally  rounded measuring 8 mm on series 2, image 2.  Study discussed by telephone with Dr. Darryll CapersSANDEEP PANDIT on 09/08/2013 at  14:44 .      Electronically Signed    By: Augusto GambleLee  Hall M.D.    On: 09/08/2013 14:50     Verified By: Kevan NyHAROLD L. HALL, M.D.,   Relevent Results:   Relevant Scans and Labs CT scans of chest and brain are reviewed   Assessment and Plan: Impression:   stage IV small cell lung cancer with brain metastasis in 64 year old female. Plan:   I discussed the case personally withDr Sherrlyn HockPandit. I have set her up for CT simulation to her whole brain. We'll plan on delivering 3000 cGy in 10 fractions. Risks of treatment including hair loss skin irritation cognitive decline all were discussed in detail with the patient and her daughter. Both seem to comprehend my treatment plan  well. I have set her up urgently as a CT simulation tomorrow I will start treatments first thing next week. Patient may also be a role for palliative radiation therapy to her chest after completion of chemotherapy should she b may want to give 3000 cGy in 10 fractions to the large left upper lobe mass.  All this was discussed with Dr Sherrlyn Hock.  I would like to take this opportunity for allowing me to participate in the care of your patient..  CC Referral:  cc: Dr. Irven Easterly   Electronic Signatures: Rushie Chestnut, Gordy Councilman (MD)  (Signed 25-Mar-15 15:29)  Authored: HPI, Diagnosis, Past Hx, PFSH, Allergies, Home Meds, ROS, Physical Exam, Other Results, Relevent Results, Encounter Assessment and Plan, CC Referring Physician   Last Updated: 25-Mar-15 15:29 by Rebeca Alert (MD)

## 2014-10-08 NOTE — Consult Note (Signed)
   Present Illness patient is a 64 year old female with history of hypertension and recent diagnosis of diabetes mellitus. She presented to the hospital with complaints of weakness and fatigue as well as midsternal chest pain that occurs when she eats or swallows. She denies any exertional component to the pain. She states she has no associated shortness of breath. Chest x-ray revealed possible pneumonia. Urine revealed possible urinary tract infection. She is a somewhat difficult historian. She denies any chest pain other than when she is eating.   Physical Exam:  GEN obese   HEENT PERRL, hearing intact to voice   NECK supple   RESP normal resp effort  clear BS   CARD Regular rate and rhythm  No murmur   ABD denies tenderness  no liver/spleen enlargement  no hernia  no Adominal Mass   LYMPH negative neck, negative axillae   EXTR negative cyanosis/clubbing, negative edema   SKIN normal to palpation   NEURO cranial nerves intact, motor/sensory function intact   PSYCH poor insight, lethargic   Review of Systems:  Subjective/Chief Complaint midsternal chest pain when eating   General: Fatigue   Skin: No Complaints   ENT: No Complaints   Eyes: No Complaints   Neck: No Complaints   Respiratory: No Complaints   Cardiovascular: Chest pain or discomfort   Gastrointestinal: Heartburn   Genitourinary: No Complaints   Vascular: No Complaints   Musculoskeletal: No Complaints   Neurologic: No Complaints   Hematologic: No Complaints   Endocrine: No Complaints   Psychiatric: No Complaints   Review of Systems: All other systems were reviewed and found to be negative   Medications/Allergies Reviewed Medications/Allergies reviewed   EKG:  EKG NSR   Abnormal NSSTTW changes    Phenergan: Itching   Impression 64 year old female with history of hypertension and recent diagnosis of diabetes mellitus was admitted with chest pain with atypical features. His midsternal.  It is associated with eating. It is nonexertional. She is ruled out for myocardial infarction. Electrocardiogram is unremarkable. Etiology of the symptoms appear to be nonischemic. Consideration for functional study would need to be carried out when she recovers from her UTI as well as after her pneumonia is improved. We'll continue with current medical therapy and treat her underlying infectious processes.   Plan 1. Continue with aspirin, beta blockers and topical nitrates for now 2. Continue to treat her UTI and probable pneumonia 3. Further recommendations depending on course however would recommend probable outpatient functional study after recovery from her UTI and pneumonia as she is ruled out for myocardial infarction and symptoms are somewhat atypical.   Electronic Signatures: Dalia HeadingFath, Kenneth A (MD)  (Signed 09-Mar-15 09:51)  Authored: General Aspect/Present Illness, History and Physical Exam, Review of System, EKG , Allergies, Impression/Plan   Last Updated: 09-Mar-15 09:51 by Dalia HeadingFath, Kenneth A (MD)

## 2014-10-16 NOTE — Consult Note (Signed)
   Comments   Josh Borders, NP, and I met with pt's family including daughter and son. Reviewed with them the options of home with hospice vs hospitalization for comfort care vs Hospice Home. After discusssion, family in agreement with Hospice Home. Ginny Ward, RN, liason for the [facility] notified and will see pt. Orders entered.   Electronic Signatures: Telly Jawad, Izora Gala (MD)  (Signed 25-Feb-16 14:31)  Authored: Palliative Care   Last Updated: 25-Feb-16 14:31 by Aribelle Mccosh, Izora Gala (MD)

## 2014-10-16 NOTE — H&P (Signed)
PATIENT NAME:  Deanna Santiago, Deanna Santiago MR#:  [id number] DATE OF BIRTH:  [birth year]  DATE OF ADMISSION:  08/11/2014  ADMITTING PHYSICIAN:  Dr. Enid Baasadhika Milianna Ericsson.    PRIMARY ONCOLOGIST:  Dr. Sherrlyn HockPandit.    CHIEF COMPLAINT: Seizures.   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 The patient is currently unresponsive, most of the history is obtained from patient's family at bedside. According to son the patient lives at home with the son and she is very active at baseline in spite of her lung cancer, last seen normal last evening, she was doing fine. Early morning at around 5:00 a.m. the son woke up to go for his dialysis, she was up and her speech was noted to be garbled and then she wanted to use the restroom and then a few minutes later she was noted to be unresponsive in the bathroom with saliva drooling over.  Because of her brain metastases she probably has had a seizure and is in postictal state now. CT of the head showed enlarged metastatic lesions in the brain with possible hemorrhage into them and the lesions seemed to be enlarged than before. Because of her progressive disease family has decided no aggressive intervention. The patient is a DNR.  At this time awaiting palliative care consultation and the patient's daughter who is driving from [place], who is the healthcare power of attorney to arrive to the hospital.   PAST MEDICAL HISTORY:  1. COPD.   2. Stage IV small cell lung cancer with brain metastases. 3. Hypertension.  4. Sleep apnea.  5. Diastolic CHF.   6. Diet-controlled diabetes mellitus.   PAST SURGICAL HISTORY: Hysterectomy.   ALLERGIES: PHENERGAN.    CURRENT HOME MEDICATIONS:  1. Coumadin 7.5 mg p.o. daily.  2. Metoprolol 50 mg p.o. b.i.d.  3. Xanax p.r.n.   SOCIAL HISTORY: The patient quit smoking in 2013. Lives at home with her son. Daughter is the healthcare power of attorney. She is usually ambulatory and takes care of her son. Denies any alcohol or recreational drug use.   FAMILY HISTORY: Remarkable for stroke, hypertension in the family.   REVIEW OF SYSTEMS: Difficult to be obtained secondary to the patient's mental status.   PHYSICAL EXAMINATION:   VITAL SIGNS:  Temperature afebrile, pulse 96, respirations 18, blood pressure 201/103, pulse oximetry 96% on 100% nonrebreather mask.  GENERAL:  Well-built, well-nourished female lying in bed, very restless and uncomfortable-appearing.  HEENT: Normocephalic, atraumatic. Pupils are 3 mm, dilated, sluggish reaction to light. Anicteric sclerae. Oropharynx appears clear at this time.  NECK: Supple, nontender.  No thyromegaly, JVD, or carotid bruits.  No lymphadenopathy.  LUNGS: Moving air bilaterally. Decreased bibasilar breath sounds. Coarse rhonchi heard at the bases.  CARDIOVASCULAR: S1, S2, regular rate and rhythm. No murmurs, rubs, or gallops.  ABDOMEN: Obese, soft, nontender, nondistended. No hepatosplenomegaly. Normal bowel sounds.  EXTREMITIES: No pedal edema. No clubbing or cyanosis. Feeble dorsalis pedis pulses palpable bilaterally.  SKIN:  No acne, rash, or lesions.   LYMPHATIC:  No cervical lymphadenopathy.  NEUROLOGIC: The patient unable to participate in any neurologic exam at this time, seems postictal. No obvious cranial nerve deficits noted.  PSYCHOLOGIC: Unable to assess at this time.  LABORATORY DATA:  1.  WBC 8.5, hemoglobin 11.8, hematocrit 37.5, platelet count 159,000.  2.  Urinalysis negative for any infection.  3.  Troponin is negative.  4.  Sodium 139, potassium 3.5, chloride 104, bicarbonate 20, BUN 13, creatinine 1.03, glucose 156, calcium of 9.6.  5.   INR is 2.8.  6.  CT of the head showing worsening metastatic disease with multiple probable hemorrhagic brain metastases, no parenchymal hemorrhage present. Mild right to left midline shift, vasogenic edema is present.   ASSESSMENT AND PLAN:  A 64 year old female with metastatic small cell lung cancer status post brain metastases, finished chemotherapy and radiation, admitted after a seizure.    1.  Status epilepticus, appears to be in postictal state now secondary to brain metastases.  Start Keppra IV b.i.d. Poor prognosis. Also started on Decadron, palliative care consulted.  2.  Hemorrhagic brain metastases, also on Coumadin at home. Discussed with Dr. Sherrlyn Hock.  Decadron, IV Keppra, palliative care, the patient is already status post whole brain radiation in the past with progressing disease.  3.  Hypertension.  Nicardipine drip now.  4.  Small cell lung cancer, metastatic, worsening. Oncology aware. Advised hospice.  5.  History of deep vein thrombosis. INR is 2.8, Coumadin on hold. Poor prognosis. Family is aware.  Daughter is healthcare power of attorney, is driving from [place]. Comfort care at that time after discussion.   TOTAL CRITICAL CARE TIME SPENT ON ADMITTING THIS PATIENT: 50 minutes.   CODE STATUS: Do Not Resuscitate.     ____________________________ Enid Baas, MD rk:bu D: 08/11/2014 15:19:20 ET Santiago: 08/11/2014 16:15:54 ET JOB#: [id number]  cc: Enid Baas, MD, <Dictator> Sandeep R. Sherrlyn Hock, MD Enid Baas MD ELECTRONICALLY SIGNED 09/01/2014 17:48

## 2014-10-16 NOTE — Discharge Summary (Signed)
PATIENT NAME:  Deanna Santiago, Zurri T MR#:  [id number] DATE OF BIRTH:  Nov 11, 1950  DATE OF ADMISSION:  08/11/2014 DATE OF DISCHARGE:  08/12/2014  ADMITTING PHYSICIAN: Dr. Enid Baasadhika Charolette Bultman.    DISCHARGING PHYSICIAN:  Dr. Enid Baasadhika Kleigh Hoelzer.    PRIMARY ONCOLOGIST:  Dr. Sherrlyn HockPandit.   CONSULTATIONS IN THE HOSPITAL: Palliative care consultation by Dr. Harriett SineNancy Phifer.   DISCHARGE DIAGNOSES:  1.  Seizures.  2.  Hemorrhagic metastatic brain disease.  3.  Small cell lung cancer stage IV with brain metastases, progressing disease in spite of chemotherapy.   4.  History of deep venous thrombosis.  5.  Hypotensive urgency.  6.  Chronic obstructive pulmonary disease.  7.  Sleep apnea.  8.  Diastolic congestive heart failure.  9.  Diet controlled diabetes mellitus.   DISCHARGE HOME MEDICATIONS:  1. Zofran 4 mg q. 8 hours p.r.n.  2. Ativan 0.5 mg 1-2 tablets sublingual q. 2-4 hours p.r.n. for agitation and anxiety.  3. Phenobarbital suppository 200 mg per rectum once a day.   4. Roxanol 20 mg per mL, 0.25 mL every 1-2 hours.  5. Decadron 4 mg p.o. b.i.d.    LABORATORY DATA AND IMAGING STUDIES ON DISCHARGE:  Please refer to the history and physical dictated by Dr. Nemiah CommanderKalisetti on the same day.   BRIEF HOSPITAL COURSE: Miss Guy FrancoClaudia Folkert is a 64 year old African-American female with stage IV metastatic small cell lung cancer with brain metastases, progressive disease in spite of radiation and also chemotherapy, presented after a seizure episode, noted that to have hemorrhaging progressively worsening brain metastases. She was also on Coumadin for history of DVT. Considering her grave prognosis and with midline shift and cerebral edema family was contacted. They confirm the patient is Do Not Resuscitate. Palliative care was consulted.  Once  daughter who is healthcare power of attorney arrived from [place], on discussion with palliative care finally the family has decided to transfer her to hospice home.   CODE  STATUS:  Do Not Resuscitate.   TIME SPENT ON DISCHARGE:  35 minutes.     ____________________________ Enid Baasadhika Sharia Averitt, MD rk:bu D: 08/11/2014 15:21:47 ET T: 08/12/2014 14:57:52 ET JOB#: [id number]  cc: Enid Baasadhika Jaylena Holloway, MD, <Dictator> Enid BaasADHIKA Azarria Balint MD ELECTRONICALLY SIGNED 09/01/2014 17:48

## 2014-11-21 IMAGING — NM NUCLEAR MEDICINE WHOLE BODY BONE SCINTIGRAPHY
2 series · 8 of 8 positions shown · non-contrast
Comparison: None

CLINICAL DATA: Diagnosis of small cell lung malignancy ; evaluate
for metastatic disease to the bone.

EXAM:
NUCLEAR MEDICINE WHOLE BODY BONE SCAN
TECHNIQUE: Whole body anterior and posterior images were obtained approximately
3 hours after intravenous injection of radiopharmaceutical.
RADIOPHARMACEUTICALS:  21.76 mCi of technetium 99 M labeled MDP
Sechnetium-VV MDP

[Series 1000: 3 hr wholebody · 2.40mm/px · 2 of 2 frames shown]
[frame 1/2]
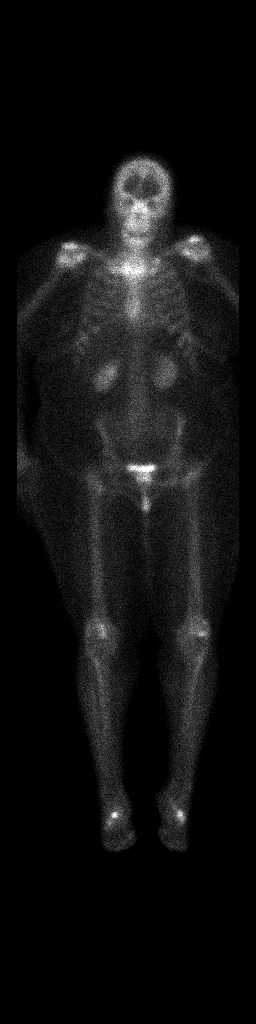
[frame 2/2]
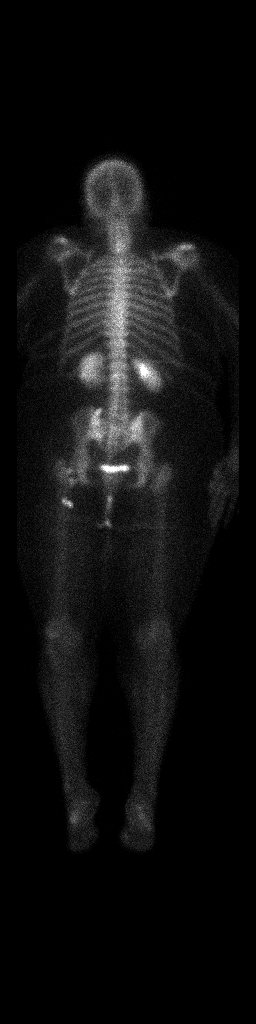

[Series 1000: statics · 2.40mm/px · 3 acquisitions, 6 frames shown]
[im 1/3]
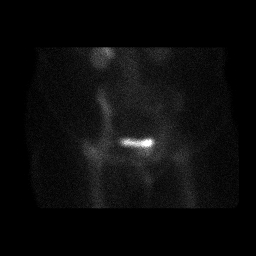
[im 1/3]
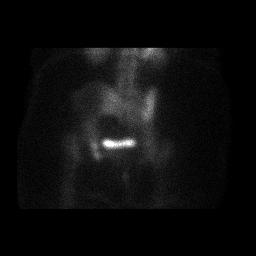
[im 2/3]
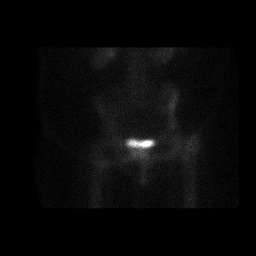
[im 2/3]
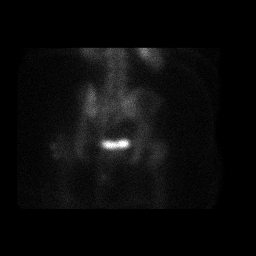
[im 3/3]
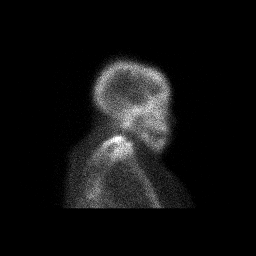
[im 3/3]
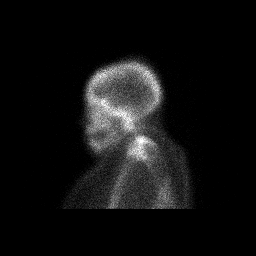

[8 of 8 positions shown; findings below may reference images not displayed]

FINDINGS: There is adequate uptake of the radiopharmaceutical by the skeleton.
There is adequate soft tissue clearance and renal activity. Activity
over the calvarium is within the limits of normal. Minimally
increased uptake in the cervical spine and at approximately L1
posteriorly is likely degenerative in nature. Normal expected uptake
in the pectoral girdle is demonstrated. There is a small amount of
increased uptake over the medial aspect of the left clavicle
asymmetric with that on the right. The activity normal expected in
the sternoclavicular joints is symmetrical. No abnormal rib activity
is demonstrated.

There is mildly increased uptake in the region of the left SI joint
superiorly. There is mildly increased uptake in the region of the
neck of the left femur as compared to the right. Inferiorly in the
subtrochanteric region on the left there is increased uptake
demonstrated. This and the left femoral neck activitymay be related
to urinary contamination. Mildly increased uptake about the knees
and ankles is compatible with degenerative change. As compared to
the right
IMPRESSION: 1. Minimally increased uptake over the medial aspect of the left
clavicle may be related to degenerative change but plain films are
recommended.
2. Increased uptake along the superior aspect of the left SI joint
merits further evaluation with plain films.
3. Increased uptake in the left femoral neck and in the left
subtrochanteric region may be related to urinary contamination.
Plain films of this region are recommended.
4. Elsewhere the uptake of the radiopharmaceutical is within the
limits of normal or is consistent with osteoarthritis. .

## 2014-11-24 IMAGING — CR DG ABDOMEN 3V
1 series · 5 of 5 positions shown · non-contrast
Comparison: Prior radiograph from 09/07/2013

CLINICAL DATA: Diffuse abdominal pain, history of lung cancer

EXAM:
ABDOMEN SERIES

[Series 5: w chest pa · 0.14mm/px · 5 of 5 slices shown]
[im 1/5]
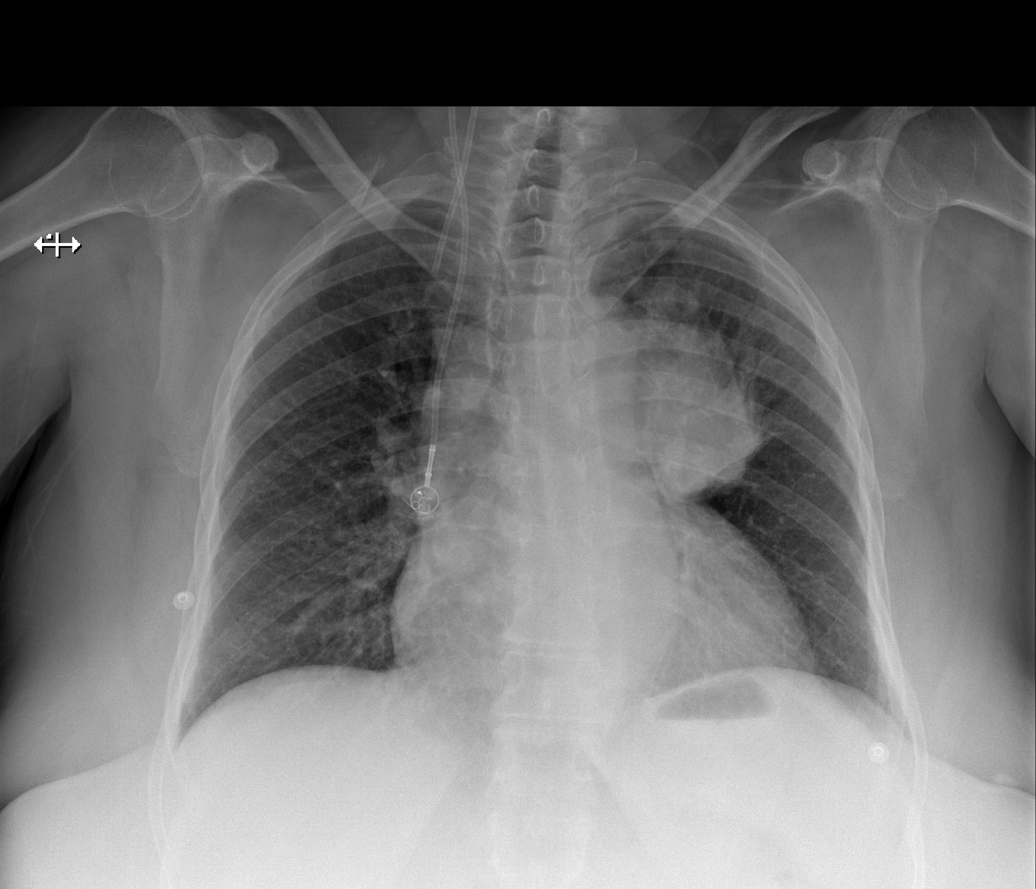
[im 2/5]
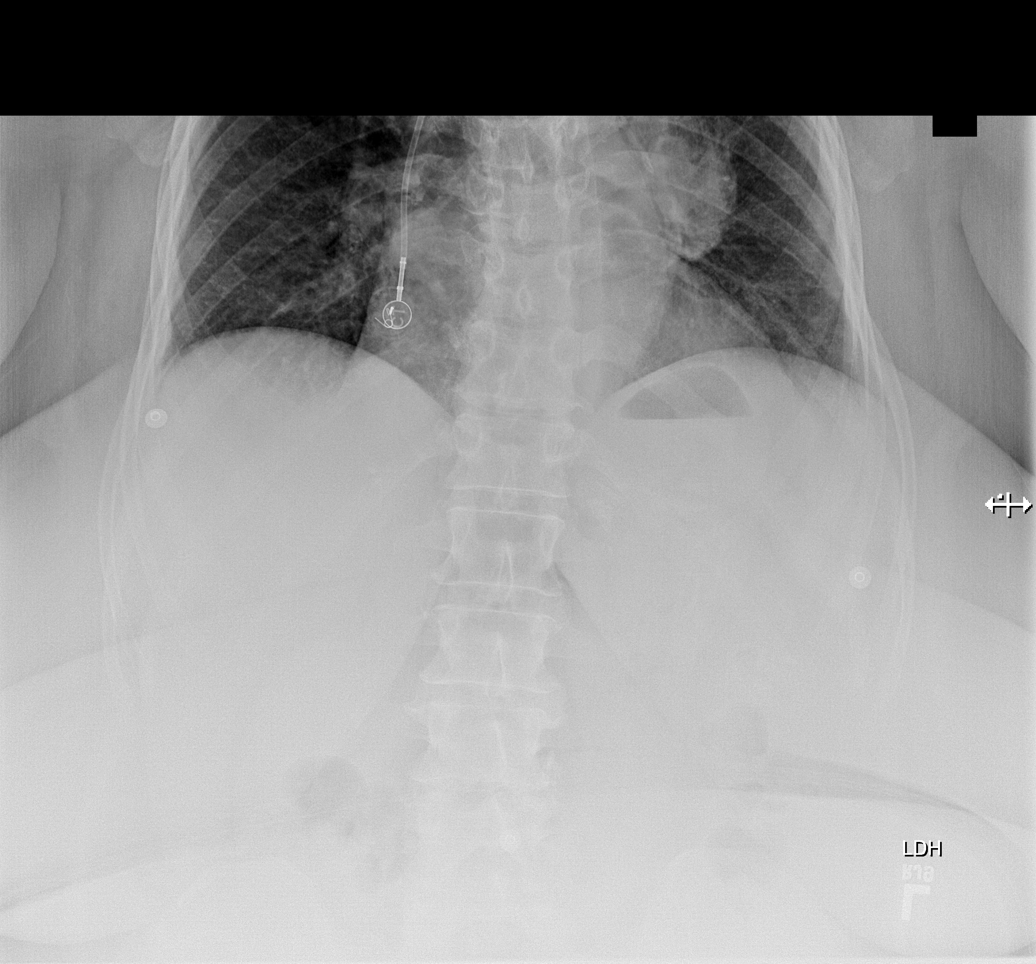
[im 3/5]
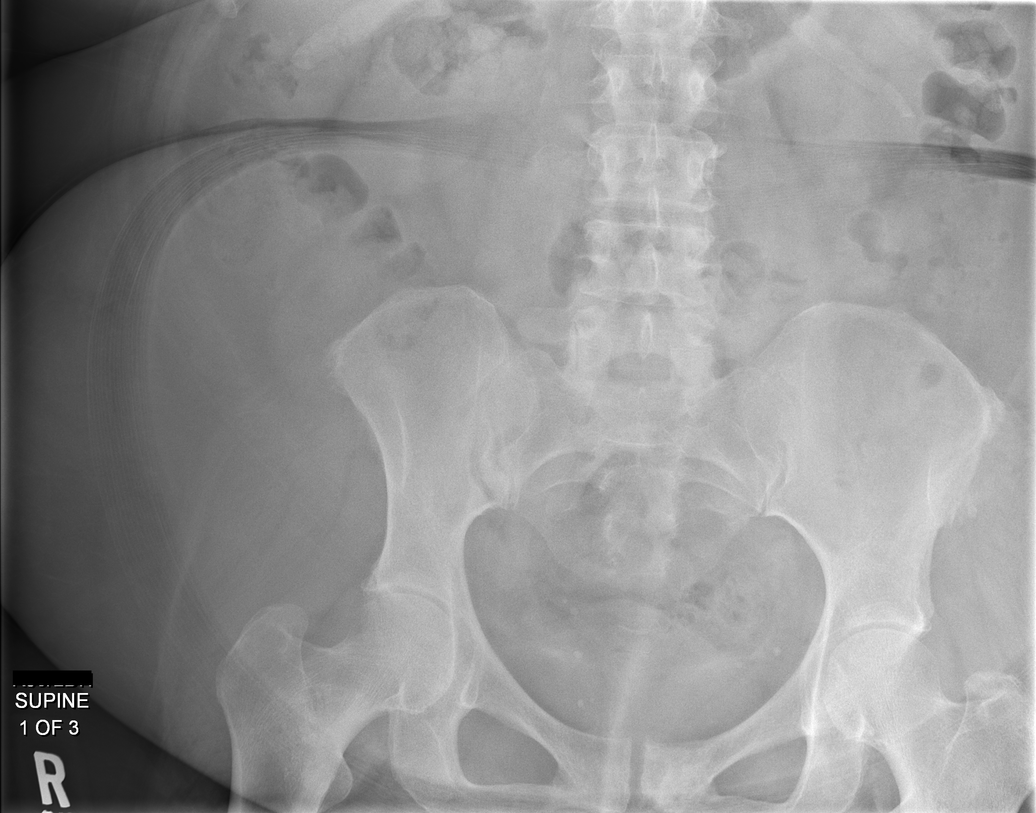
[im 4/5]
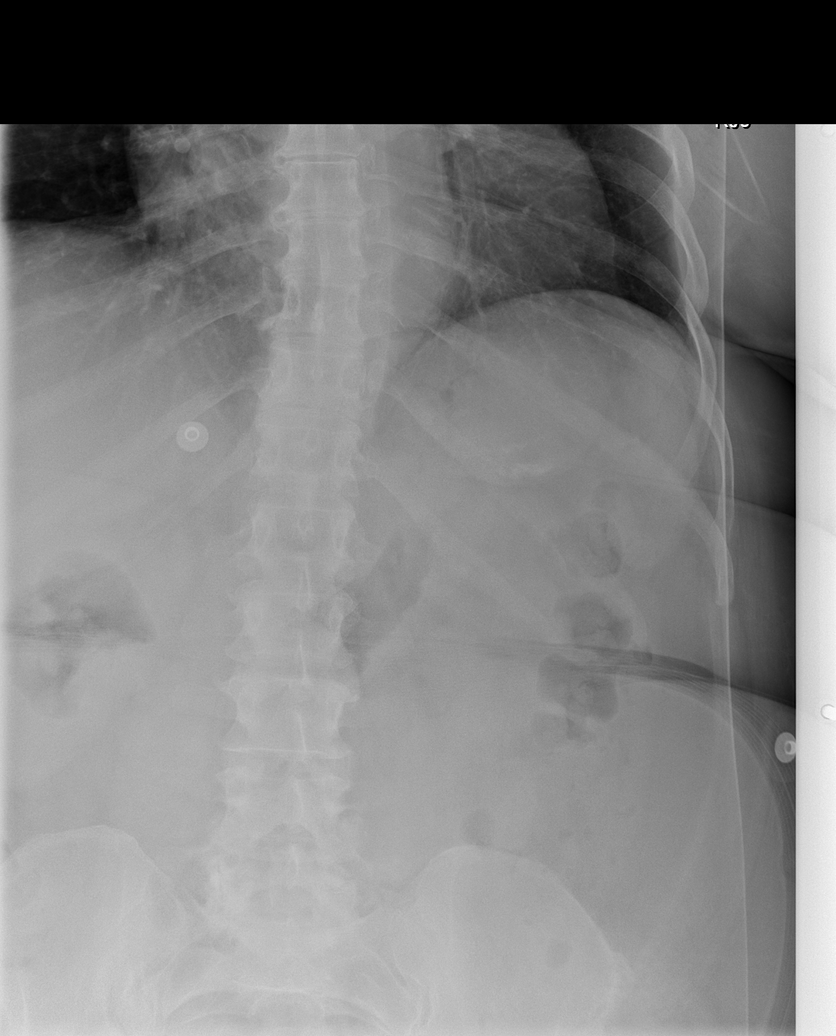
[im 5/5]
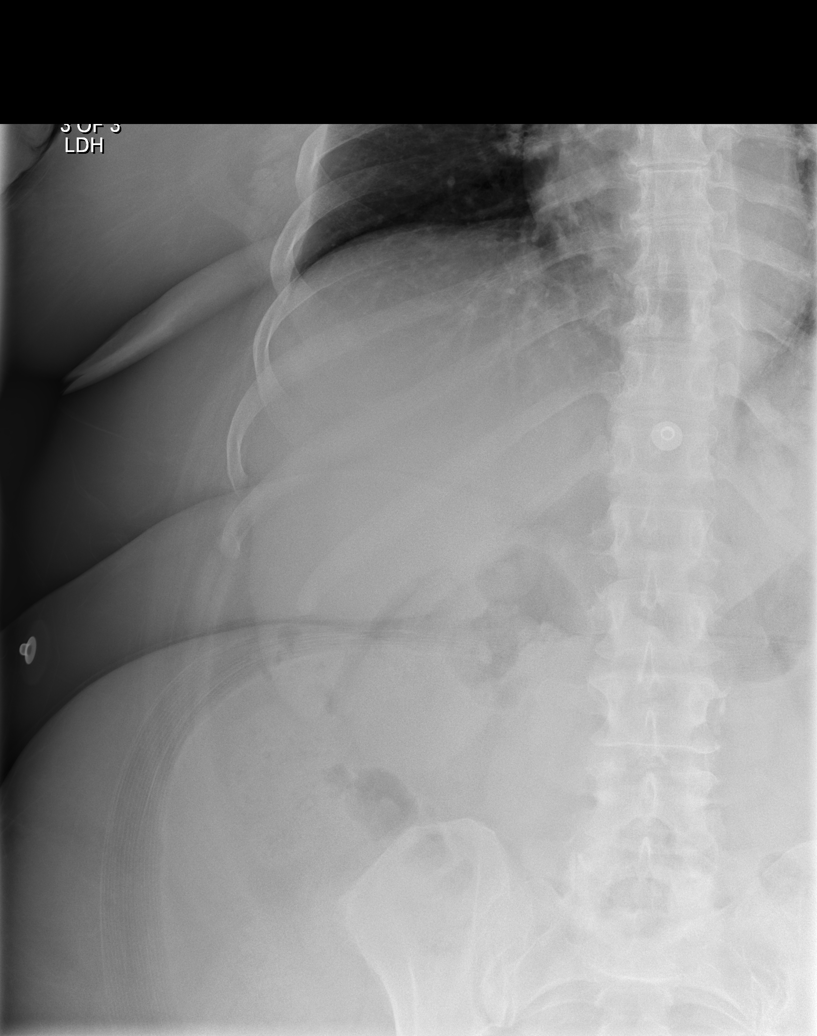

[5 of 5 positions shown; findings below may reference images not displayed]

FINDINGS: Right-sided Port-A-Cath is unchanged. Heart size is stable. No
significant interval change in large left upper lobe mass.

Lungs are clear without focal infiltrate, pulmonary edema, or
pleural effusion. No pneumothorax.

Scattered nondilated gas and stool full loops of bowel seen within
the abdomen. No evidence of obstruction or ileus. Moderate amount of
retained stool present within the colon. No free intraperitoneal
air. No soft tissue mass or abnormal calcifications.

Mild degenerative changes noted within the lower lumbar spine. No
acute osseous abnormality.
IMPRESSION: 1. Nonobstructive bowel gas pattern. Moderate amount of retained
stool within the colon.
2. No significant interval change in left upper lobe mass with no
acute cardiopulmonary abnormality identified.
